# Patient Record
Sex: Male | Born: 1965 | Race: White | Hispanic: No | Marital: Married | Smoking: Never smoker
Health system: Southern US, Community
[De-identification: ages and names within clinical notes are randomized; demographics above are authoritative.]

## PROBLEM LIST (undated history)

## (undated) DIAGNOSIS — Z9109 Other allergy status, other than to drugs and biological substances: Secondary | ICD-10-CM

## (undated) HISTORY — PX: TONSILLECTOMY: SUR1361

---

## 2013-08-26 ENCOUNTER — Ambulatory Visit: Payer: 59 | Admitting: Nurse Practitioner

## 2013-08-26 ENCOUNTER — Encounter (INDEPENDENT_AMBULATORY_CARE_PROVIDER_SITE_OTHER): Payer: Self-pay

## 2013-08-26 ENCOUNTER — Encounter: Payer: Self-pay | Admitting: Nurse Practitioner

## 2013-08-26 VITALS — BP 128/82 | HR 61 | Temp 98.5°F | Ht 72.0 in | Wt 204.4 lb

## 2013-08-26 DIAGNOSIS — Z0289 Encounter for other administrative examinations: Secondary | ICD-10-CM

## 2013-08-26 LAB — POCT URINALYSIS DIPSTICK
Bilirubin, UA: NEGATIVE
GLUCOSE UA: NEGATIVE
Ketones, UA: NEGATIVE
Leukocytes, UA: NEGATIVE
Nitrite, UA: NEGATIVE
Protein, UA: NEGATIVE
RBC UA: NEGATIVE
SPEC GRAV UA: 1.01
Urobilinogen, UA: NEGATIVE
pH, UA: 5

## 2013-08-26 NOTE — Patient Instructions (Signed)
Medical Examiner's Certificate  I certify that I have examined Collin Crosby. In accordance with the [employer] Safety Regulations [phone](49 CFR [phone]) and with knowledge of the driving duties, I find this person is qualified; and, if applicable, only when:      The information I have provided regarding this physical examination is true and complete. A complete examination form with any attachment embodies my findings completely and correctly, and is on file in my office.   _________________________________________ Bennie PieriniMARTIN,MARY MARGARET 08/26/2013  __________________________________________ _________[id number]___________ ___[place]_  Signature of Driver Driver's License No. State    Address of Driver [id number] [place] [id number]  Medical Certificate Expiration Date 08/27/15

## 2013-08-26 NOTE — Progress Notes (Signed)
Commercial Driver Medical Examination   Collin Crosby is a 48 y.o. male who presents today for a commercial driver fitness determination physical exam. The patient reports no problems. The following portions of the patient's history were reviewed and updated as appropriate: allergies, current medications, past family history, past medical history, past social history, past surgical history and problem list. Review of Systems Pertinent items are noted in HPI.   Objective:    Vision:  Uncorrected Corrected Horizontal Field of Vision  Right Eye 20/15  >70 degrees  Left Eye  20/15  >70 degrees  Both Eyes  20/15     Applicant can recognize and distinguish among traffic control signals and devices showing standard red, green, and amber colors.     Monocular Vision?: No   Hearing: whisper test positive     There were no vitals taken for this visit.  General Appearance:    Alert, cooperative, no distress, appears stated age  Head:    Normocephalic, without obvious abnormality, atraumatic  Eyes:    PERRL, conjunctiva/corneas clear, EOM's intact, fundi    benign, both eyes       Ears:    Normal TM's and external ear canals, both ears  Nose:   Nares normal, septum midline, mucosa normal, no drainage    or sinus tenderness  Throat:   Lips, mucosa, and tongue normal; teeth and gums normal  Neck:   Supple, symmetrical, trachea midline, no adenopathy;       thyroid:  No enlargement/tenderness/nodules; no carotid   bruit or JVD  Back:     Symmetric, no curvature, ROM normal, no CVA tenderness  Lungs:     Clear to auscultation bilaterally, respirations unlabored  Chest Vill[place]:    No tenderness or deformity  Heart:    Regular rate and rhythm, S1 and S2 normal, no murmur, rub   or gallop  Abdomen:     Soft, non-tender, bowel sounds active all four quadrants,    no masses, no organomegaly  Genitalia:    Normal male without lesion, discharge or tenderness  Rectal:    Normal tone, normal  prostate, no masses or tenderness;   guaiac negative stool  Extremities:   Extremities normal, atraumatic, no cyanosis or edema  Pulses:   2+ and symmetric all extremities  Skin:   Skin color, texture, turgor normal, no rashes or lesions  Lymph nodes:   Cervical, supraclavicular, and axillary nodes normal  Neurologic:   CNII-XII intact. Normal strength, sensation and reflexes      throughout    Labs: Results for orders placed in visit on 08/26/13  POCT URINALYSIS DIPSTICK      Result Value Ref Range   Color, UA yellow     Clarity, UA clear     Glucose, UA negative     Bilirubin, UA negative     Ketones, UA negative     Spec Grav, UA 1.010     Blood, UA negative     pH, UA 5.0     Protein, UA negative     Urobilinogen, UA negative     Nitrite, UA negative     Leukocytes, UA Negative           Assessment:    Healthy male exam.  Meets standards in 2249 CFR 391.41;  qualifies for 2 year certificate.    Plan:    Medical examiners certificate completed and printed. Return as needed.   Mary-Margaret Daphine DeutscherMartin, FNP

## 2014-04-20 ENCOUNTER — Ambulatory Visit: Payer: Self-pay | Admitting: Nurse Practitioner

## 2015-08-02 ENCOUNTER — Ambulatory Visit (INDEPENDENT_AMBULATORY_CARE_PROVIDER_SITE_OTHER): Payer: Self-pay | Admitting: Nurse Practitioner

## 2015-08-02 ENCOUNTER — Encounter: Payer: Self-pay | Admitting: Nurse Practitioner

## 2015-08-02 VITALS — BP 123/83 | HR 71 | Temp 98.6°F | Ht 71.0 in | Wt 210.0 lb

## 2015-08-02 DIAGNOSIS — Z024 Encounter for examination for driving license: Secondary | ICD-10-CM

## 2015-08-02 LAB — URINALYSIS
Bilirubin, UA: NEGATIVE
Glucose, UA: NEGATIVE
Ketones, UA: NEGATIVE
LEUKOCYTES UA: NEGATIVE
NITRITE UA: NEGATIVE
Protein, UA: NEGATIVE
RBC, UA: NEGATIVE
Specific Gravity, UA: 1.02 (ref 1.005–1.030)
UUROB: 0.2 mg/dL (ref 0.2–1.0)
pH, UA: 5.5 (ref 5.0–7.5)

## 2015-08-02 NOTE — Progress Notes (Signed)
Patient ID: Collin PilgrimMichael Alan Crosby, male   DOB: [birth year], 50 y.o.   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

## 2015-10-03 ENCOUNTER — Encounter: Payer: 59 | Admitting: Nurse Practitioner

## 2016-04-10 ENCOUNTER — Telehealth: Payer: Self-pay

## 2016-04-10 NOTE — Telephone Encounter (Signed)
MMM is out until next week. What does pt need?

## 2016-04-11 NOTE — Telephone Encounter (Signed)
Patient states that he already talked to someone yesterday and will call back if he needs anything else.

## 2016-05-17 ENCOUNTER — Telehealth: Payer: Self-pay | Admitting: Nurse Practitioner

## 2016-05-17 NOTE — Telephone Encounter (Signed)
Ok to write note for patient

## 2016-05-18 NOTE — Telephone Encounter (Signed)
Letter written and to front for pt pick up

## 2016-11-15 ENCOUNTER — Ambulatory Visit (INDEPENDENT_AMBULATORY_CARE_PROVIDER_SITE_OTHER): Payer: 59 | Admitting: Nurse Practitioner

## 2016-11-15 ENCOUNTER — Encounter: Payer: Self-pay | Admitting: Nurse Practitioner

## 2016-11-15 VITALS — BP 126/84 | HR 59 | Temp 97.4°F | Ht 71.0 in | Wt 210.0 lb

## 2016-11-15 DIAGNOSIS — M5442 Lumbago with sciatica, left side: Secondary | ICD-10-CM | POA: Diagnosis not present

## 2016-11-15 MED ORDER — METHYLPREDNISOLONE ACETATE 80 MG/ML IJ SUSP
80.0000 mg | Freq: Once | INTRAMUSCULAR | Status: AC
  Administered 2016-11-15: 80 mg via INTRAMUSCULAR

## 2016-11-15 MED ORDER — CYCLOBENZAPRINE HCL 10 MG PO TABS: 10.0000 mg | ORAL_TABLET | Freq: Three times a day (TID) | ORAL | 1 refills | Status: DC | PRN

## 2016-11-15 MED ORDER — IBUPROFEN 600 MG PO TABS: 600.0000 mg | ORAL_TABLET | Freq: Three times a day (TID) | ORAL | 0 refills | Status: DC | PRN

## 2016-11-15 NOTE — Progress Notes (Signed)
   Subjective:    Patient ID: Collin Crosby, male    DOB: 26-Nov-1965, 50 y.o.   MRN: [id number]  HPI Patient in the office with complaints of low back pain and left leg pain.  Patient is starting to feel some better following resting for 3-4 weeks.  Patient states the only incident of cause he can recall is that he felt like he pulled a muscle when lifting the ramp of a trailer.  Patient has tried ice packs and this seems to help.  No medications tried to date.     Review of Systems  Constitutional: Positive for activity change (resting). Negative for appetite change.  Musculoskeletal: Positive for back pain (x 3-4 wks, bilateral lower back, but more on left). Negative for arthralgias, gait problem and myalgias.       Pain radiating down the medial left thigh when trying to lift his leg    Neurological: Negative for dizziness, light-headedness and numbness.  All other systems reviewed and are negative.      Objective:   Physical Exam  Constitutional: He is oriented to person, place, and time. He appears well-developed and well-nourished. No distress.  Cardiovascular: Normal rate, regular rhythm and normal heart sounds.   Pulmonary/Chest: Effort normal and breath sounds normal.  Musculoskeletal: Normal range of motion. He exhibits no edema.       Lumbar back: Normal. He exhibits normal range of motion, no tenderness, no swelling and no pain.  Motor strength and sensation lower ext intact and eqyually bil.  Neurological: He is alert and oriented to person, place, and time.  Psychiatric: He has a normal mood and affect. His behavior is normal.   BP 126/84   Pulse (!) 59   Temp (!) 97.4 F (36.3 C) (Oral)   Ht  (1.803 m)   Wt 210 lb (95.3 kg)   BMI 29.29 kg/m        Assessment & Plan:  1. Acute midline low back pain with left-sided sciatica Back exercises Moist heat - methylPREDNISolone acetate (DEPO-MEDROL) injection 80 mg; Inject 1 mL (80 mg total) into the muscle  once. - ibuprofen (ADVIL,MOTRIN) 600 MG tablet; Take 1 tablet (600 mg total) by mouth every 8 (eight) hours as needed.  Dispense: 30 tablet; Refill: 0 - cyclobenzaprine (FLEXERIL) 10 MG tablet; Take 1 tablet (10 mg total) by mouth 3 (three) times daily as needed for muscle spasms.  Dispense: 30 tablet; Refill: 1    Labs pending Health maintenance reviewed Diet and exercise encouraged Continue all meds Follow up  In prn   Mary-Margaret Daphine Deutscher, FNP

## 2016-11-15 NOTE — Patient Instructions (Signed)

## 2017-03-06 ENCOUNTER — Telehealth: Payer: Self-pay | Admitting: Nurse Practitioner

## 2017-03-07 NOTE — Telephone Encounter (Signed)
Ok to write letter

## 2017-03-07 NOTE — Telephone Encounter (Signed)
Letter written and pt aware ready for pick up. 

## 2017-07-09 ENCOUNTER — Ambulatory Visit: Payer: Self-pay | Admitting: Nurse Practitioner

## 2017-07-09 ENCOUNTER — Encounter: Payer: Self-pay | Admitting: Nurse Practitioner

## 2017-07-09 DIAGNOSIS — Z024 Encounter for examination for driving license: Secondary | ICD-10-CM

## 2017-07-09 NOTE — Progress Notes (Signed)
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

## 2017-07-25 ENCOUNTER — Encounter: Payer: Self-pay | Admitting: Nurse Practitioner

## 2017-07-25 ENCOUNTER — Ambulatory Visit (INDEPENDENT_AMBULATORY_CARE_PROVIDER_SITE_OTHER): Payer: 59 | Admitting: Nurse Practitioner

## 2017-07-25 ENCOUNTER — Ambulatory Visit (INDEPENDENT_AMBULATORY_CARE_PROVIDER_SITE_OTHER): Payer: 59

## 2017-07-25 VITALS — BP 113/75 | HR 67 | Temp 97.0°F | Ht 71.0 in | Wt 196.0 lb

## 2017-07-25 DIAGNOSIS — Z125 Encounter for screening for malignant neoplasm of prostate: Secondary | ICD-10-CM

## 2017-07-25 DIAGNOSIS — Z0000 Encounter for general adult medical examination without abnormal findings: Secondary | ICD-10-CM

## 2017-07-25 DIAGNOSIS — R81 Glycosuria: Secondary | ICD-10-CM

## 2017-07-25 DIAGNOSIS — Z6827 Body mass index (BMI) 27.0-27.9, adult: Secondary | ICD-10-CM | POA: Diagnosis not present

## 2017-07-25 DIAGNOSIS — M25552 Pain in left hip: Secondary | ICD-10-CM

## 2017-07-25 LAB — BAYER DCA HB A1C WAIVED: HB A1C (BAYER DCA - WAIVED): 4.9 % (ref <7.0)

## 2017-07-25 MED ORDER — PREDNISONE 10 MG (21) PO TBPK: ORAL_TABLET | ORAL | 0 refills | Status: DC

## 2017-07-25 NOTE — Progress Notes (Signed)
   Subjective:    Patient ID: Collin Crosby, male    DOB: 1965/03/11, 52 y.o.   MRN: [id number]   Chief Complaint: Annual Exam   HPI Patient come sin today for annual physical exam. He had a DOT physical several weeks ago and had sugar in urine and was told he needed a physical. He has no current medical problems and is on no meds. He say she feels well. He has been under a lot of stress and has lost 14lbs. He does c/o of left hip pain. Has been bothering him for about 2 months. Rates pain 3-4/10. He has had a steriod shot which really did not help.    Review of Systems  Constitutional: Negative for activity change and appetite change.  HENT: Negative.   Eyes: Negative for pain.  Respiratory: Negative for shortness of breath.   Cardiovascular: Negative for chest pain, palpitations and leg swelling.  Gastrointestinal: Negative for abdominal pain.  Endocrine: Negative for polydipsia.  Genitourinary: Negative.   Skin: Negative for rash.  Neurological: Negative for dizziness, weakness and headaches.  Hematological: Does not bruise/bleed easily.  Psychiatric/Behavioral: Negative.   All other systems reviewed and are negative.      Objective:   Physical Exam  Constitutional: He is oriented to person, place, and time.  HENT:  Head: Normocephalic.  Nose: Nose normal.  Mouth/Throat: Oropharynx is clear and moist.  Eyes: Pupils are equal, round, and reactive to light. EOM are normal.  Neck: Normal range of motion and phonation normal. Neck supple. No JVD present. Carotid bruit is not present. No thyroid mass and no thyromegaly present.  Cardiovascular: Normal rate and regular rhythm.  Pulmonary/Chest: Effort normal and breath sounds normal. No respiratory distress.  Abdominal: Soft. Normal appearance, normal aorta and bowel sounds are normal. There is no tenderness.  Musculoskeletal: Normal range of motion.  FROM of left hip with pain on internal rotation  Lymphadenopathy:   He has no cervical adenopathy.  Neurological: He is alert and oriented to person, place, and time.  Skin: Skin is warm and dry.  Psychiatric: Judgment normal.  Nursing note and vitals reviewed.   BP 113/75   Pulse 67   Temp (!) 97 F (36.1 C) (Oral)   Ht '5\' 11"'$  (1.803 m)   Wt 196 lb (88.9 kg)   BMI 27.34 kg/m   Hip xray- joint space narrowing of left hip-Preliminary reading by Ronnald Collum, FNP  [facility]      Assessment & Plan:  Collin Crosby comes in today with chief complaint of Annual Exam   Diagnosis and orders addressed:  1. Annual physical exam - CMP14+EGFR - Lipid panel  2. Glucose found in urine on examination - Bayer DCA Hb A1c Waived  3. Screening for prostate cancer - PSA, total and free  4. BMI 27.0-27.9,adult Discussed diet and exercise for person with BMI >25 Will recheck weight in 3-6 months  5. Left hip pain If no better will do ortho referral - DG HIP UNILAT W OR W/O PELVIS 2-3 VIEWS LEFT; Future - predniSONE (STERAPRED UNI-PAK 21 TAB) 10 MG (21) TBPK tablet; As directed x 6 days  Dispense: 21 tablet; Refill: 0   Labs pending Health Maintenance reviewed Diet and exercise encouraged  Follow up plan: 6 months   Mary-Margaret Hassell Done, FNP

## 2017-07-25 NOTE — Patient Instructions (Signed)

## 2017-07-26 LAB — CMP14+EGFR
ALBUMIN: 4 g/dL (ref 3.5–5.5)
ALT: 17 IU/L (ref 0–44)
AST: 13 IU/L (ref 0–40)
Albumin/Globulin Ratio: 1.7 (ref 1.2–2.2)
Alkaline Phosphatase: 82 IU/L (ref 39–117)
BILIRUBIN TOTAL: 0.6 mg/dL (ref 0.0–1.2)
BUN / CREAT RATIO: 12 (ref 9–20)
BUN: 10 mg/dL (ref 6–24)
CHLORIDE: 105 mmol/L (ref 96–106)
CO2: 25 mmol/L (ref 20–29)
Calcium: 8.6 mg/dL — ABNORMAL LOW (ref 8.7–10.2)
Creatinine, Ser: 0.81 mg/dL (ref 0.76–1.27)
GFR calc non Af Amer: 103 mL/min/{1.73_m2} (ref >59)
GFR, EST AFRICAN AMERICAN: 119 mL/min/{1.73_m2} (ref >59)
Globulin, Total: 2.3 g/dL (ref 1.5–4.5)
Glucose: 92 mg/dL (ref 65–99)
POTASSIUM: 4.3 mmol/L (ref 3.5–5.2)
Sodium: 141 mmol/L (ref 134–144)
TOTAL PROTEIN: 6.3 g/dL (ref 6.0–8.5)

## 2017-07-26 LAB — LIPID PANEL
Chol/HDL Ratio: 4.1 ratio (ref 0.0–5.0)
Cholesterol, Total: 157 mg/dL (ref 100–199)
HDL: 38 mg/dL — ABNORMAL LOW (ref >39)
LDL Calculated: 85 mg/dL (ref 0–99)
Triglycerides: 169 mg/dL — ABNORMAL HIGH (ref 0–149)
VLDL CHOLESTEROL CAL: 34 mg/dL (ref 5–40)

## 2017-07-26 LAB — PSA, TOTAL AND FREE
PSA, Free Pct: 41.3 %
PSA, Free: 0.33 ng/mL
Prostate Specific Ag, Serum: 0.8 ng/mL (ref 0.0–4.0)

## 2017-08-01 ENCOUNTER — Telehealth: Payer: Self-pay | Admitting: Nurse Practitioner

## 2017-08-12 ENCOUNTER — Ambulatory Visit (INDEPENDENT_AMBULATORY_CARE_PROVIDER_SITE_OTHER): Payer: 59 | Admitting: Nurse Practitioner

## 2017-08-12 ENCOUNTER — Encounter: Payer: Self-pay | Admitting: Nurse Practitioner

## 2017-08-12 VITALS — BP 124/79 | HR 77 | Temp 97.2°F | Ht 71.0 in | Wt 196.0 lb

## 2017-08-12 DIAGNOSIS — M25552 Pain in left hip: Secondary | ICD-10-CM | POA: Diagnosis not present

## 2017-08-12 NOTE — Progress Notes (Signed)
   Subjective:    Patient ID: Collin PilgrimMichael Alan Maue, male    DOB: [birth year], 52 y.o.   MRN: [id number]   Chief Complaint: Hip Pain (Left)   HPI Patient was in on 07/25/17 for annual physical exam. At that time he c/o hip pain. Xray was done which showed mid degenerative changes. He was given a 6 day steroid pack. He says he can tell no difference. He says it feels as if it is going to pop out of joint. Seems to bother him the most at night. Sitting seems to increase pain as well. Walking doe snot seem to bother him. Pain stays in hip joint and does not radiate. Rates pain currenlty 2-3/10. Pain can go as high as 6-7/10 intermittently.   Review of Systems  Constitutional: Negative.   Respiratory: Negative.   Cardiovascular: Negative.   Musculoskeletal: Positive for arthralgias (left hip).  Neurological: Negative.   Psychiatric/Behavioral: Negative.   All other systems reviewed and are negative.      Objective:   Physical Exam  Constitutional: He is oriented to person, place, and time. He appears well-developed and well-nourished. He appears distressed (mild).  Cardiovascular: Normal rate.  Pulmonary/Chest: Effort normal and breath sounds normal.  Musculoskeletal: He exhibits no edema.  Decrease ROM of left hip with pain on etension and internal rotation.  Neurological: He is alert and oriented to person, place, and time. Coordination normal.  Psychiatric: He has a normal mood and affect. His behavior is normal. Judgment and thought content normal.   BP 124/79   Pulse 77   Temp (!) 97.2 F (36.2 C) (Oral)   Ht 5\' 11"  (1.803 m)   Wt 196 lb (88.9 kg)   BMI 27.34 kg/m       Assessment & Plan:  Collin PilgrimMichael Alan Deberry in today with chief complaint of Hip Pain (Left)   1. Left hip pain Moist heat to hip rest - Ambulatory referral to Orthopedic Surgery rto prn  Mary-Margaret Daphine DeutscherMartin, FNP

## 2017-08-12 NOTE — Patient Instructions (Signed)

## 2017-10-29 ENCOUNTER — Ambulatory Visit: Payer: 59 | Admitting: Nurse Practitioner

## 2017-11-26 ENCOUNTER — Encounter: Payer: Self-pay | Admitting: Nurse Practitioner

## 2017-11-26 ENCOUNTER — Ambulatory Visit (INDEPENDENT_AMBULATORY_CARE_PROVIDER_SITE_OTHER): Payer: 59

## 2017-11-26 ENCOUNTER — Ambulatory Visit (INDEPENDENT_AMBULATORY_CARE_PROVIDER_SITE_OTHER): Payer: 59 | Admitting: Nurse Practitioner

## 2017-11-26 VITALS — BP 120/78 | HR 67 | Temp 98.3°F | Ht 71.0 in | Wt 201.0 lb

## 2017-11-26 DIAGNOSIS — Z23 Encounter for immunization: Secondary | ICD-10-CM | POA: Diagnosis not present

## 2017-11-26 DIAGNOSIS — Z01818 Encounter for other preprocedural examination: Secondary | ICD-10-CM

## 2017-11-26 NOTE — Progress Notes (Signed)
Subjective:    Patient ID: Collin Crosby, male    DOB: 01/15/1966, 51 y.o.   MRN: [id number]   Chief Complaint: surgical clearance   HPI Patient comes in today getting ready for hip replacement. He has no other medical problems and is on no meds.    Review of Systems  Constitutional: Negative for activity change and appetite change.  HENT: Negative.   Eyes: Negative for pain.  Respiratory: Negative for shortness of breath.   Cardiovascular: Negative for chest pain, palpitations and leg swelling.  Gastrointestinal: Negative for abdominal pain.  Endocrine: Negative for polydipsia.  Genitourinary: Negative.   Skin: Negative for rash.  Neurological: Negative for dizziness, weakness and headaches.  Hematological: Does not bruise/bleed easily.  Psychiatric/Behavioral: Negative.   All other systems reviewed and are negative.      Objective:   Physical Exam  Constitutional: He is oriented to person, place, and time. He appears well-developed and well-nourished.  HENT:  Head: Normocephalic.  Nose: Nose normal.  Mouth/Throat: Oropharynx is clear and moist.  Eyes: Pupils are equal, round, and reactive to light. EOM are normal.  Neck: Normal range of motion and phonation normal. Neck supple. No JVD present. Carotid bruit is not present. No thyroid mass and no thyromegaly present.  Cardiovascular: Normal rate and regular rhythm.  Pulmonary/Chest: Effort normal and breath sounds normal. No respiratory distress.  Abdominal: Soft. Normal appearance, normal aorta and bowel sounds are normal. There is no tenderness.  Musculoskeletal: Normal range of motion.  Lymphadenopathy:    He has no cervical adenopathy.  Neurological: He is alert and oriented to person, place, and time.  Skin: Skin is warm and dry.  Psychiatric: He has a normal mood and affect. His behavior is normal. Judgment and thought content normal.  Nursing note and vitals reviewed.   BP 120/78   Pulse 67   Temp  98.3 F (36.8 C) (Oral)   Ht 5' 11" (1.803 m)   Wt 201 lb (91.2 kg)   BMI 28.03 kg/m   Chest xray - clear-Preliminary reading by Mary , FNP  [facility] EKG- NSR- Mary-Margaret , FNP      Assessment & Plan:  Collin Crosby in today with chief complaint of surgical clearance   1. Preoperative clearance medically cleared for surgery - DG Chest 2 View; Future - EKG 12-Lead - CMP14+EGFR - CBC with Differential/Platelet  Mary-Margaret , FNP  

## 2017-11-29 NOTE — Patient Instructions (Signed)
Collin Crosby  11/29/2017   Your procedure is scheduled on: 12-10-17     Report to [facility] Main  Entrance    Report to Admitting at 9:00 AM    Call this number if you have problems the morning of surgery [phone]     Remember: Do not eat food or drink liquids :After Midnight.    Take these medicines the morning of surgery with A SIP OF WATER: None. You may bring and use your nasal spray as needed.                BRUSH YOUR TEETH MORNING OF SURGERY AND RINSE YOUR MOUTH OUT, NO CHEWING GUM CANDY OR MINTS.              You may not have any metal on your body including hair pins and              piercings  Do not wear jewelry, lotions, cologne, powders or deodorant             Men may shave face and neck.   Do not bring valuables to the hospital. [name]   FOR VALUABLES.  Contacts, dentures or bridgework may not be worn into surgery.  Leave suitcase in the car. After surgery it may be brought to your room.     Special Instructions: N/A              Please read over the following fact sheets you were given: _____________________________________________________________________             [name] Before surgery, you can play an important role.  Because skin is not sterile, your skin needs to be as free of germs as possible.  You can reduce the number of germs on your skin by washing with CHG (chlorahexidine gluconate) soap before surgery.  CHG is an antiseptic cleaner which kills germs and bonds with the skin to continue killing germs even after washing. Please DO NOT use if you have an allergy to CHG or antibacterial soaps.  If your skin becomes reddened/irritated stop using the CHG and inform your nurse when you arrive at Short Stay. Do not shave (including legs and underarms) for at least 48 hours prior to the first CHG shower.  You may shave your face/neck. Please follow these  instructions carefully:  1.  Shower with CHG Soap the night before surgery and the  morning of Surgery.  2.  If you choose to wash your hair, wash your hair first as usual with your  normal  shampoo.  3.  After you shampoo, rinse your hair and body thoroughly to remove the  shampoo.                           4.  Use CHG as you would any other liquid soap.  You can apply chg directly  to the skin and wash                       Gently with a scrungie or clean washcloth.  5.  Apply the CHG Soap to your body ONLY FROM THE NECK DOWN.   Do not use on face/ open  Wound or open sores. Avoid contact with eyes, ears mouth and genitals (private parts).                       Wash face,  Genitals (private parts) with your normal soap.             6.  Wash thoroughly, paying special attention to the area where your surgery  will be performed.  7.  Thoroughly rinse your body with warm water from the neck down.  8.  DO NOT shower/wash with your normal soap after using and rinsing off  the CHG Soap.                9.  Pat yourself dry with a clean towel.            10.  Wear clean pajamas.            11.  Place clean sheets on your bed the night of your first shower and do not  sleep with pets. Day of Surgery : Do not apply any lotions/deodorants the morning of surgery.  Please wear clean clothes to the hospital/surgery center.  FAILURE TO FOLLOW THESE INSTRUCTIONS MAY RESULT IN THE CANCELLATION OF YOUR SURGERY PATIENT SIGNATURE_________________________________  NURSE SIGNATURE__________________________________  ________________________________________________________________________   Collin Crosby  An incentive spirometer is a tool that can help keep your lungs clear and active. This tool measures how well you are filling your lungs with each breath. Taking long deep breaths may help reverse or decrease the chance of developing breathing (pulmonary) problems (especially  infection) following:  A long period of time when you are unable to move or be active. BEFORE THE PROCEDURE   If the spirometer includes an indicator to show your best effort, your nurse or respiratory therapist will set it to a desired goal.  If possible, sit up straight or lean slightly forward. Try not to slouch.  Hold the incentive spirometer in an upright position. INSTRUCTIONS FOR USE  1. Sit on the edge of your bed if possible, or sit up as far as you can in bed or on a chair. 2. Hold the incentive spirometer in an upright position. 3. Breathe out normally. 4. Place the mouthpiece in your mouth and seal your lips tightly around it. 5. Breathe in slowly and as deeply as possible, raising the piston or the ball toward the top of the column. 6. Hold your breath for 3-5 seconds or for as long as possible. Allow the piston or ball to fall to the bottom of the column. 7. Remove the mouthpiece from your mouth and breathe out normally. 8. Rest for a few seconds and repeat Steps 1 through 7 at least 10 times every 1-2 hours when you are awake. Take your time and take a few normal breaths between deep breaths. 9. The spirometer may include an indicator to show your best effort. Use the indicator as a goal to work toward during each repetition. 10. After each set of 10 deep breaths, practice coughing to be sure your lungs are clear. If you have an incision (the cut made at the time of surgery), support your incision when coughing by placing a pillow or rolled up towels firmly against it. Once you are able to get out of bed, walk around indoors and cough well. You may stop using the incentive spirometer when instructed by your caregiver.  RISKS AND COMPLICATIONS  Take your time so you do not get  dizzy or light-headed.  If you are in pain, you may need to take or ask for pain medication before doing incentive spirometry. It is harder to take a deep breath if you are having pain. AFTER  USE  Rest and breathe slowly and easily.  It can be helpful to keep track of a log of your progress. Your caregiver can provide you with a simple table to help with this. If you are using the spirometer at home, follow these instructions: [place] IF:   You are having difficultly using the spirometer.  You have trouble using the spirometer as often as instructed.  Your pain medication is not giving enough relief while using the spirometer.  You develop fever of 100.5 F (38.1 C) or higher. SEEK IMMEDIATE MEDICAL CARE IF:   You cough up bloody sputum that had not been present before.  You develop fever of 102 F (38.9 C) or greater.  You develop worsening pain at or near the incision site. MAKE SURE YOU:   Understand these instructions.  Will watch your condition.  Will get help right away if you are not doing well or get worse. Document Released: 06/25/2006 Document Revised: 05/07/2011 Document Reviewed: 08/26/2006 Exit[employer] Patient Information 2014 Exit[employer], [place].   ________________________________________________________________________  WHAT IS A BLOOD TRANSFUSION? Blood Transfusion Information  A transfusion is the replacement of blood or some of its parts. Blood is made up of multiple cells which provide different functions.  Red blood cells carry oxygen and are used for blood loss replacement.  White blood cells fight against infection.  Platelets control bleeding.  Plasma helps clot blood.  Other blood products are available for specialized needs, such as hemophilia or other clotting disorders. BEFORE THE TRANSFUSION  Who gives blood for transfusions?   Healthy volunteers who are fully evaluated to make sure their blood is safe. This is blood bank blood. Transfusion therapy is the safest it has ever been in the practice of medicine. Before blood is taken from a donor, a complete history is taken to make sure that person has no history of diseases  nor engages in risky social behavior (examples are intravenous drug use or sexual activity with multiple partners). The donor's travel history is screened to minimize risk of transmitting infections, such as malaria. The donated blood is tested for signs of infectious diseases, such as HIV and hepatitis. The blood is then tested to be sure it is compatible with you in order to minimize the chance of a transfusion reaction. If you or a relative donates blood, this is often done in anticipation of surgery and is not appropriate for emergency situations. It takes many days to process the donated blood. RISKS AND COMPLICATIONS Although transfusion therapy is very safe and saves many lives, the main dangers of transfusion include:   Getting an infectious disease.  Developing a transfusion reaction. This is an allergic reaction to something in the blood you were given. Every precaution is taken to prevent this. The decision to have a blood transfusion has been considered carefully by your caregiver before blood is given. Blood is not given unless the benefits outweigh the risks. AFTER THE TRANSFUSION  Right after receiving a blood transfusion, you will usually feel much better and more energetic. This is especially true if your red blood cells have gotten low (anemic). The transfusion raises the level of the red blood cells which carry oxygen, and this usually causes an energy increase.  The nurse administering the transfusion will  monitor you carefully for complications. HOME CARE INSTRUCTIONS  No special instructions are needed after a transfusion. You may find your energy is better. Speak with your caregiver about any limitations on activity for underlying diseases you may have. SEEK MEDICAL CARE IF:   Your condition is not improving after your transfusion.  You develop redness or irritation at the intravenous (IV) site. SEEK IMMEDIATE MEDICAL CARE IF:  Any of the following symptoms occur over the  next 12 hours:  Shaking chills.  You have a temperature by mouth above 102 F (38.9 C), not controlled by medicine.  Chest, back, or muscle pain.  People around you feel you are not acting correctly or are confused.  Shortness of breath or difficulty breathing.  Dizziness and fainting.  You get a rash or develop hives.  You have a decrease in urine output.  Your urine turns a dark color or changes to pink, red, or brown. Any of the following symptoms occur over the next 10 days:  You have a temperature by mouth above 102 F (38.9 C), not controlled by medicine.  Shortness of breath.  Weakness after normal activity.  The white part of the eye turns yellow (jaundice).  You have a decrease in the amount of urine or are urinating less often.  Your urine turns a dark color or changes to pink, red, or brown. Document Released: 02/10/2000 Document Revised: 05/07/2011 Document Reviewed: 09/29/2007 [facility] Patient Information 2014 [place], [place].  _______________________________________________________________________

## 2017-11-29 NOTE — Progress Notes (Addendum)
11-26-17 (Epic) EKG,  CXR, Surgical Clearance from Dr. Daphine Deutscher

## 2017-12-03 ENCOUNTER — Other Ambulatory Visit: Payer: Self-pay

## 2017-12-03 ENCOUNTER — Encounter (HOSPITAL_COMMUNITY): Payer: Self-pay

## 2017-12-03 ENCOUNTER — Encounter (HOSPITAL_COMMUNITY)
Admission: RE | Admit: 2017-12-03 | Discharge: 2017-12-03 | Disposition: A | Discharge status: Home / Self Care | Payer: 59 | Source: Ambulatory Visit | Attending: Orthopedic Surgery | Admitting: Orthopedic Surgery

## 2017-12-03 DIAGNOSIS — Z01818 Encounter for other preprocedural examination: Secondary | ICD-10-CM | POA: Diagnosis present

## 2017-12-03 DIAGNOSIS — M1612 Unilateral primary osteoarthritis, left hip: Secondary | ICD-10-CM | POA: Insufficient documentation

## 2017-12-03 HISTORY — DX: Other allergy status, other than to drugs and biological substances: Z91.09

## 2017-12-03 LAB — CBC
HEMATOCRIT: 47.1 % (ref 39.0–52.0)
Hemoglobin: 16.3 g/dL (ref 13.0–17.0)
MCH: 31.3 pg (ref 26.0–34.0)
MCHC: 34.6 g/dL (ref 30.0–36.0)
MCV: 90.4 fL (ref 80.0–100.0)
Platelets: 193 10*3/uL (ref 150–400)
RBC: 5.21 MIL/uL (ref 4.22–5.81)
RDW: 12.2 % (ref 11.5–15.5)
WBC: 4.9 10*3/uL (ref 4.0–10.5)
nRBC: 0 % (ref 0.0–0.2)

## 2017-12-03 LAB — SURGICAL PCR SCREEN
MRSA, PCR: NEGATIVE
STAPHYLOCOCCUS AUREUS: POSITIVE — AB

## 2017-12-03 LAB — BASIC METABOLIC PANEL
Anion gap: 8 (ref 5–15)
BUN: 13 mg/dL (ref 6–20)
CHLORIDE: 108 mmol/L (ref 98–111)
CO2: 27 mmol/L (ref 22–32)
CREATININE: 0.93 mg/dL (ref 0.61–1.24)
Calcium: 9.3 mg/dL (ref 8.9–10.3)
GFR calc Af Amer: >60 mL/min (ref >60)
GFR calc non Af Amer: >60 mL/min (ref >60)
Glucose, Bld: 113 mg/dL — ABNORMAL HIGH (ref 70–99)
POTASSIUM: 4.3 mmol/L (ref 3.5–5.1)
Sodium: 143 mmol/L (ref 135–145)

## 2017-12-03 LAB — ABO/RH: ABO/RH(D): AB NEG

## 2017-12-07 NOTE — H&P (Signed)
TOTAL HIP ADMISSION H&P  Patient is admitted for left total hip arthroplasty, anterior approach.  Subjective:  Chief Complaint:   Left hip primary OA / pain  HPI: Collin Crosby, 52 y.o. male, has a history of pain and functional disability in the left hip(s) due to arthritis and patient has failed non-surgical conservative treatments for greater than 12 weeks to include NSAID's and/or analgesics and activity modification.  Onset of symptoms was gradual starting ~1 years ago with gradually worsening course since that time.The patient noted no past surgery on the left hip(s).  Patient currently rates pain in the left hip at 5 out of 10 with activity. Patient has night pain, worsening of pain with activity and weight bearing, trendelenberg gait, pain that interfers with activities of daily living and pain with passive range of motion. Patient has evidence of periarticular osteophytes and joint space narrowing by imaging studies. This condition presents safety issues increasing the risk of falls.  There is no current active infection.  Risks, benefits and expectations were discussed with the patient.  Risks including but not limited to the risk of anesthesia, blood clots, nerve damage, blood vessel damage, failure of the prosthesis, infection and up to and including death.  Patient understand the risks, benefits and expectations and wishes to proceed with surgery.   PCP: Bennie Pierini, FNP  D/C Plans:       Home   Post-op Meds:       No Rx given   Tranexamic Acid:      To be given - IV   Decadron:      Is to be given  FYI:      ASA  [employer]  DME:   Pt already has equipment  PT:   No PT   Patient Active Problem List   Diagnosis Date Noted  . BMI 27.0-27.9,adult 07/25/2017   Past Medical History:  Diagnosis Date  . Environmental allergies     Past Surgical History:  Procedure Laterality Date  . TONSILLECTOMY      No current facility-administered medications for this  encounter.    Current Outpatient Medications  Medication Sig Dispense Refill Last Dose  . fluticasone (FLONASE) 50 MCG/ACT nasal spray Place 1 spray into both nostrils daily as needed for allergies or rhinitis.   Taking   Allergies  Allergen Reactions  . Shellfish Allergy Anaphylaxis, Itching, Swelling, Rash and Other (See Comments)    IV Dye and Shrimp  . Penicillins Itching, Rash and Other (See Comments)    Has patient had a PCN reaction causing immediate rash, facial/tongue/throat swelling, SOB or lightheadedness with hypotension: No Has patient had a PCN reaction causing severe rash involving mucus membranes or skin necrosis: No Has patient had a PCN reaction that required hospitalization: No Has patient had a PCN reaction occurring within the last 10 years: No If all of the above answers are "NO", then may proceed with Cephalosporin use.     Social History   Tobacco Use  . Smoking status: Never Smoker  . Smokeless tobacco: Never Used  Substance Use Topics  . Alcohol use: Yes    Comment: social       Review of Systems  Constitutional: Negative.   HENT: Negative.   Eyes: Negative.   Respiratory: Negative.   Cardiovascular: Negative.   Gastrointestinal: Negative.   Genitourinary: Negative.   Musculoskeletal: Positive for joint pain.  Skin: Negative.   Neurological: Negative.   Endo/Heme/Allergies: Positive for environmental allergies.  Psychiatric/Behavioral: Negative.  Objective:  Physical Exam  Constitutional: He is oriented to person, place, and time. He appears well-developed.  HENT:  Head: Normocephalic.  Eyes: Pupils are equal, round, and reactive to light.  Neck: Neck supple. No JVD present. No tracheal deviation present. No thyromegaly present.  Cardiovascular: Normal rate, regular rhythm and intact distal pulses.  Respiratory: Effort normal and breath sounds normal. No respiratory distress. He has no wheezes.  GI: Soft. There is no tenderness. There  is no guarding.  Musculoskeletal:       Left hip: He exhibits decreased range of motion, decreased strength, tenderness and bony tenderness. He exhibits no swelling, no deformity and no laceration.  Lymphadenopathy:    He has no cervical adenopathy.  Neurological: He is alert and oriented to person, place, and time.  Skin: Skin is warm and dry.  Psychiatric: He has a normal mood and affect.     Labs:  Estimated body mass index is 27.25 kg/m as calculated from the following:   Height as of 12/03/17: 5\' 11"  (1.803 m).   Weight as of 12/03/17: 88.6 kg.   Imaging Review Plain radiographs demonstrate severe degenerative joint disease of the left hip(s). The bone quality appears to be good for age and reported activity level.    Preoperative templating of the joint replacement has been completed, documented, and submitted to the Operating Room personnel in order to optimize intra-operative equipment management.     Assessment/Plan:  End stage arthritis, left hip(s)  The patient history, physical examination, clinical judgement of the provider and imaging studies are consistent with end stage degenerative joint disease of the left hip(s) and total hip arthroplasty is deemed medically necessary. The treatment options including medical management, injection therapy, arthroscopy and arthroplasty were discussed at length. The risks and benefits of total hip arthroplasty were presented and reviewed. The risks due to aseptic loosening, infection, stiffness, dislocation/subluxation,  thromboembolic complications and other imponderables were discussed.  The patient acknowledged the explanation, agreed to proceed with the plan and consent was signed. Patient is being admitted for inpatient treatment for surgery, pain control, PT, OT, prophylactic antibiotics, VTE prophylaxis, progressive ambulation and ADL's and discharge planning.The patient is planning to be discharged home.      Anastasio Auerbach  Angla Delahunt   PA-C  12/07/2017, 10:17 PM

## 2017-12-10 ENCOUNTER — Inpatient Hospital Stay (HOSPITAL_COMMUNITY): Payer: 59

## 2017-12-10 ENCOUNTER — Inpatient Hospital Stay (HOSPITAL_COMMUNITY)
Admission: RE | Admit: 2017-12-10 | Discharge: 2017-12-11 | DRG: 470 | Disposition: A | Discharge status: Home / Self Care | Payer: 59 | Attending: Orthopedic Surgery | Admitting: Orthopedic Surgery

## 2017-12-10 ENCOUNTER — Other Ambulatory Visit: Payer: Self-pay

## 2017-12-10 ENCOUNTER — Encounter (HOSPITAL_COMMUNITY)
Admission: RE | Disposition: A | Discharge status: Home / Self Care | Payer: Self-pay | Source: Home / Self Care | Attending: Orthopedic Surgery

## 2017-12-10 ENCOUNTER — Inpatient Hospital Stay (HOSPITAL_COMMUNITY): Payer: 59 | Admitting: Anesthesiology

## 2017-12-10 ENCOUNTER — Encounter (HOSPITAL_COMMUNITY): Payer: Self-pay | Admitting: Anesthesiology

## 2017-12-10 DIAGNOSIS — Z91013 Allergy to seafood: Secondary | ICD-10-CM | POA: Diagnosis not present

## 2017-12-10 DIAGNOSIS — M25752 Osteophyte, left hip: Secondary | ICD-10-CM | POA: Diagnosis present

## 2017-12-10 DIAGNOSIS — Z91041 Radiographic dye allergy status: Secondary | ICD-10-CM | POA: Diagnosis not present

## 2017-12-10 DIAGNOSIS — Z880 Allergy status to penicillin: Secondary | ICD-10-CM | POA: Diagnosis not present

## 2017-12-10 DIAGNOSIS — Z96649 Presence of unspecified artificial hip joint: Secondary | ICD-10-CM

## 2017-12-10 DIAGNOSIS — Z96642 Presence of left artificial hip joint: Secondary | ICD-10-CM

## 2017-12-10 DIAGNOSIS — M1612 Unilateral primary osteoarthritis, left hip: Principal | ICD-10-CM | POA: Diagnosis present

## 2017-12-10 DIAGNOSIS — Z79899 Other long term (current) drug therapy: Secondary | ICD-10-CM

## 2017-12-10 DIAGNOSIS — Z419 Encounter for procedure for purposes other than remedying health state, unspecified: Secondary | ICD-10-CM

## 2017-12-10 HISTORY — PX: TOTAL HIP ARTHROPLASTY: SHX124

## 2017-12-10 LAB — TYPE AND SCREEN
ABO/RH(D): AB NEG
Antibody Screen: NEGATIVE

## 2017-12-10 SURGERY — ARTHROPLASTY, HIP, TOTAL, ANTERIOR APPROACH
Anesthesia: Monitor Anesthesia Care | Site: Hip | Laterality: Left

## 2017-12-10 MED ORDER — PROPOFOL 10 MG/ML IV BOLUS
INTRAVENOUS | Status: AC
  Filled 2017-12-10: qty 20

## 2017-12-10 MED ORDER — ALUM & MAG HYDROXIDE-SIMETH 200-200-20 MG/5ML PO SUSP: 15.0000 mL | ORAL | Status: DC | PRN

## 2017-12-10 MED ORDER — METHOCARBAMOL 500 MG PO TABS: 500.0000 mg | ORAL_TABLET | Freq: Four times a day (QID) | ORAL | 0 refills | Status: DC | PRN

## 2017-12-10 MED ORDER — PROPOFOL 10 MG/ML IV BOLUS
INTRAVENOUS | Status: DC | PRN
  Administered 2017-12-10: 10 mg via INTRAVENOUS

## 2017-12-10 MED ORDER — CHLORHEXIDINE GLUCONATE 4 % EX LIQD: 60.0000 mL | Freq: Once | CUTANEOUS | Status: DC

## 2017-12-10 MED ORDER — TRANEXAMIC ACID-NACL 1000-0.7 MG/100ML-% IV SOLN
1000.0000 mg | INTRAVENOUS | Status: AC
  Administered 2017-12-10: 1000 mg via INTRAVENOUS
  Filled 2017-12-10: qty 100

## 2017-12-10 MED ORDER — SODIUM CHLORIDE 0.9 % IR SOLN
Status: DC | PRN
  Administered 2017-12-10: 1000 mL

## 2017-12-10 MED ORDER — MORPHINE SULFATE (PF) 4 MG/ML IV SOLN: 0.5000 mg | INTRAVENOUS | Status: DC | PRN

## 2017-12-10 MED ORDER — SODIUM CHLORIDE 0.9 % IV SOLN
INTRAVENOUS | Status: DC
  Administered 2017-12-10 – 2017-12-11 (×2): via INTRAVENOUS

## 2017-12-10 MED ORDER — FERROUS SULFATE 325 (65 FE) MG PO TABS: 325.0000 mg | ORAL_TABLET | Freq: Three times a day (TID) | ORAL | 3 refills | Status: DC

## 2017-12-10 MED ORDER — CEFAZOLIN SODIUM-DEXTROSE 2-4 GM/100ML-% IV SOLN
2.0000 g | INTRAVENOUS | Status: AC
  Administered 2017-12-10: 2 g via INTRAVENOUS
  Filled 2017-12-10: qty 100

## 2017-12-10 MED ORDER — BUPIVACAINE IN DEXTROSE 0.75-8.25 % IT SOLN
INTRATHECAL | Status: DC | PRN
  Administered 2017-12-10: 2 mL via INTRATHECAL

## 2017-12-10 MED ORDER — ACETAMINOPHEN 325 MG PO TABS: 325.0000 mg | ORAL_TABLET | Freq: Four times a day (QID) | ORAL | Status: DC | PRN

## 2017-12-10 MED ORDER — ASPIRIN 81 MG PO CHEW: 81.0000 mg | CHEWABLE_TABLET | Freq: Two times a day (BID) | ORAL | 0 refills | Status: AC

## 2017-12-10 MED ORDER — TRANEXAMIC ACID-NACL 1000-0.7 MG/100ML-% IV SOLN
1000.0000 mg | Freq: Once | INTRAVENOUS | Status: AC
  Administered 2017-12-10: 1000 mg via INTRAVENOUS
  Filled 2017-12-10: qty 100

## 2017-12-10 MED ORDER — METOCLOPRAMIDE HCL 5 MG PO TABS: 5.0000 mg | ORAL_TABLET | Freq: Three times a day (TID) | ORAL | Status: DC | PRN

## 2017-12-10 MED ORDER — MIDAZOLAM HCL 5 MG/5ML IJ SOLN
INTRAMUSCULAR | Status: DC | PRN
  Administered 2017-12-10: 2 mg via INTRAVENOUS

## 2017-12-10 MED ORDER — PHENYLEPHRINE 40 MCG/ML (10ML) SYRINGE FOR IV PUSH (FOR BLOOD PRESSURE SUPPORT)
PREFILLED_SYRINGE | INTRAVENOUS | Status: DC | PRN
  Administered 2017-12-10 (×2): 40 ug via INTRAVENOUS
  Administered 2017-12-10: 80 ug via INTRAVENOUS
  Administered 2017-12-10: 40 ug via INTRAVENOUS

## 2017-12-10 MED ORDER — HYDROCODONE-ACETAMINOPHEN 7.5-325 MG PO TABS: 1.0000 | ORAL_TABLET | ORAL | 0 refills | Status: DC | PRN

## 2017-12-10 MED ORDER — MIDAZOLAM HCL 2 MG/2ML IJ SOLN
INTRAMUSCULAR | Status: AC
  Filled 2017-12-10: qty 2

## 2017-12-10 MED ORDER — FERROUS SULFATE 325 (65 FE) MG PO TABS
325.0000 mg | ORAL_TABLET | Freq: Three times a day (TID) | ORAL | Status: DC
  Administered 2017-12-11 (×2): 325 mg via ORAL
  Filled 2017-12-10 (×3): qty 1

## 2017-12-10 MED ORDER — FENTANYL CITRATE (PF) 100 MCG/2ML IJ SOLN
INTRAMUSCULAR | Status: DC | PRN
  Administered 2017-12-10 (×2): 25 ug via INTRAVENOUS
  Administered 2017-12-10: 50 ug via INTRAVENOUS

## 2017-12-10 MED ORDER — CEFAZOLIN SODIUM-DEXTROSE 2-4 GM/100ML-% IV SOLN
2.0000 g | Freq: Four times a day (QID) | INTRAVENOUS | Status: AC
  Administered 2017-12-10 (×2): 2 g via INTRAVENOUS
  Filled 2017-12-10 (×2): qty 100

## 2017-12-10 MED ORDER — DOCUSATE SODIUM 100 MG PO CAPS
100.0000 mg | ORAL_CAPSULE | Freq: Two times a day (BID) | ORAL | Status: DC
  Administered 2017-12-10 – 2017-12-11 (×2): 100 mg via ORAL
  Filled 2017-12-10 (×3): qty 1

## 2017-12-10 MED ORDER — PHENOL 1.4 % MT LIQD: 1.0000 | OROMUCOSAL | Status: DC | PRN

## 2017-12-10 MED ORDER — HYDROCODONE-ACETAMINOPHEN 5-325 MG PO TABS
1.0000 | ORAL_TABLET | ORAL | Status: DC | PRN
  Administered 2017-12-10: 1 via ORAL
  Administered 2017-12-10 – 2017-12-11 (×3): 2 via ORAL
  Filled 2017-12-10: qty 2
  Filled 2017-12-10: qty 1
  Filled 2017-12-10 (×2): qty 2

## 2017-12-10 MED ORDER — DEXAMETHASONE SODIUM PHOSPHATE 10 MG/ML IJ SOLN
10.0000 mg | Freq: Once | INTRAMUSCULAR | Status: AC
  Administered 2017-12-10: 10 mg via INTRAVENOUS

## 2017-12-10 MED ORDER — METHOCARBAMOL 500 MG IVPB - SIMPLE MED
500.0000 mg | Freq: Four times a day (QID) | INTRAVENOUS | Status: DC | PRN
  Filled 2017-12-10: qty 50

## 2017-12-10 MED ORDER — HYDROMORPHONE HCL 1 MG/ML IJ SOLN: 0.2500 mg | INTRAMUSCULAR | Status: DC | PRN

## 2017-12-10 MED ORDER — MENTHOL 3 MG MT LOZG: 1.0000 | LOZENGE | OROMUCOSAL | Status: DC | PRN

## 2017-12-10 MED ORDER — DIPHENHYDRAMINE HCL 12.5 MG/5ML PO ELIX
12.5000 mg | ORAL_SOLUTION | ORAL | Status: DC | PRN
  Administered 2017-12-11: 12.5 mg via ORAL
  Filled 2017-12-10: qty 5

## 2017-12-10 MED ORDER — DOCUSATE SODIUM 100 MG PO CAPS: 100.0000 mg | ORAL_CAPSULE | Freq: Two times a day (BID) | ORAL | 0 refills | Status: DC

## 2017-12-10 MED ORDER — FLUTICASONE PROPIONATE 50 MCG/ACT NA SUSP: 1.0000 | Freq: Every day | NASAL | Status: DC | PRN

## 2017-12-10 MED ORDER — METOCLOPRAMIDE HCL 5 MG/ML IJ SOLN: 5.0000 mg | Freq: Three times a day (TID) | INTRAMUSCULAR | Status: DC | PRN

## 2017-12-10 MED ORDER — ONDANSETRON HCL 4 MG PO TABS: 4.0000 mg | ORAL_TABLET | Freq: Four times a day (QID) | ORAL | Status: DC | PRN

## 2017-12-10 MED ORDER — PHENYLEPHRINE 40 MCG/ML (10ML) SYRINGE FOR IV PUSH (FOR BLOOD PRESSURE SUPPORT)
PREFILLED_SYRINGE | INTRAVENOUS | Status: AC
  Filled 2017-12-10: qty 10

## 2017-12-10 MED ORDER — ASPIRIN 81 MG PO CHEW
81.0000 mg | CHEWABLE_TABLET | Freq: Two times a day (BID) | ORAL | Status: DC
  Administered 2017-12-10 – 2017-12-11 (×2): 81 mg via ORAL
  Filled 2017-12-10 (×3): qty 1

## 2017-12-10 MED ORDER — MAGNESIUM CITRATE PO SOLN: 1.0000 | Freq: Once | ORAL | Status: DC | PRN

## 2017-12-10 MED ORDER — STERILE WATER FOR IRRIGATION IR SOLN
Status: DC | PRN
  Administered 2017-12-10: 2000 mL

## 2017-12-10 MED ORDER — CELECOXIB 200 MG PO CAPS
200.0000 mg | ORAL_CAPSULE | Freq: Two times a day (BID) | ORAL | Status: DC
  Administered 2017-12-10 – 2017-12-11 (×2): 200 mg via ORAL
  Filled 2017-12-10 (×3): qty 1

## 2017-12-10 MED ORDER — DEXAMETHASONE SODIUM PHOSPHATE 10 MG/ML IJ SOLN
10.0000 mg | Freq: Once | INTRAMUSCULAR | Status: AC
  Administered 2017-12-11: 10 mg via INTRAVENOUS
  Filled 2017-12-10: qty 1

## 2017-12-10 MED ORDER — BISACODYL 10 MG RE SUPP: 10.0000 mg | Freq: Every day | RECTAL | Status: DC | PRN

## 2017-12-10 MED ORDER — POLYETHYLENE GLYCOL 3350 17 G PO PACK
17.0000 g | PACK | Freq: Two times a day (BID) | ORAL | Status: DC
  Administered 2017-12-10 – 2017-12-11 (×2): 17 g via ORAL
  Filled 2017-12-10 (×3): qty 1

## 2017-12-10 MED ORDER — POLYETHYLENE GLYCOL 3350 17 G PO PACK: 17.0000 g | PACK | Freq: Two times a day (BID) | ORAL | 0 refills | Status: DC

## 2017-12-10 MED ORDER — METHOCARBAMOL 500 MG PO TABS
500.0000 mg | ORAL_TABLET | Freq: Four times a day (QID) | ORAL | Status: DC | PRN
  Administered 2017-12-10: 500 mg via ORAL
  Filled 2017-12-10: qty 1

## 2017-12-10 MED ORDER — FENTANYL CITRATE (PF) 100 MCG/2ML IJ SOLN
INTRAMUSCULAR | Status: AC
  Filled 2017-12-10: qty 2

## 2017-12-10 MED ORDER — HYDROCODONE-ACETAMINOPHEN 7.5-325 MG PO TABS: 1.0000 | ORAL_TABLET | ORAL | Status: DC | PRN

## 2017-12-10 MED ORDER — ONDANSETRON HCL 4 MG/2ML IJ SOLN
INTRAMUSCULAR | Status: DC | PRN
  Administered 2017-12-10: 4 mg via INTRAVENOUS

## 2017-12-10 MED ORDER — PROPOFOL 500 MG/50ML IV EMUL
INTRAVENOUS | Status: DC | PRN
  Administered 2017-12-10: 75 ug/kg/min via INTRAVENOUS

## 2017-12-10 MED ORDER — LACTATED RINGERS IV SOLN
INTRAVENOUS | Status: DC
  Administered 2017-12-10 (×2): via INTRAVENOUS

## 2017-12-10 MED ORDER — ONDANSETRON HCL 4 MG/2ML IJ SOLN: 4.0000 mg | Freq: Four times a day (QID) | INTRAMUSCULAR | Status: DC | PRN

## 2017-12-10 SURGICAL SUPPLY — 43 items
BAG ZIPLOCK 12X15 (MISCELLANEOUS) ×2 IMPLANT
BLADE SAG 18X100X1.27 (BLADE) ×2 IMPLANT
CHLORAPREP W/TINT 26ML (MISCELLANEOUS) ×2 IMPLANT
COVER PERINEAL POST (MISCELLANEOUS) ×2 IMPLANT
COVER SURGICAL LIGHT HANDLE (MISCELLANEOUS) ×2 IMPLANT
COVER WAND RF STERILE (DRAPES) ×2 IMPLANT
CUP ACETBLR 54 OD PINNACLE (Hips) ×2 IMPLANT
DERMABOND ADVANCED (GAUZE/BANDAGES/DRESSINGS) ×1
DERMABOND ADVANCED .7 DNX12 (GAUZE/BANDAGES/DRESSINGS) ×1 IMPLANT
DRAPE EENT ADH APERT 31X51 STR (DRAPES) ×2 IMPLANT
DRAPE U-SHAPE 47X51 STRL (DRAPES) ×4 IMPLANT
DRESSING AQUACEL AG SP 3.5X10 (GAUZE/BANDAGES/DRESSINGS) ×1 IMPLANT
DRSG AQUACEL AG SP 3.5X10 (GAUZE/BANDAGES/DRESSINGS) ×2
ELECT REM PT RETURN 15FT ADLT (MISCELLANEOUS) ×2 IMPLANT
ELIMINATOR HOLE APEX DEPUY (Hips) ×2 IMPLANT
FEM STEM 12/14 TAPER SZ 4 HIP (Orthopedic Implant) ×2 IMPLANT
FEMORAL STEM 12/14 TPR SZ4 HIP (Orthopedic Implant) ×1 IMPLANT
GLOVE BIOGEL M STRL SZ7.5 (GLOVE) ×4 IMPLANT
GLOVE BIOGEL PI IND STRL 7.0 (GLOVE) ×2 IMPLANT
GLOVE BIOGEL PI IND STRL 7.5 (GLOVE) ×5 IMPLANT
GLOVE BIOGEL PI IND STRL 8.5 (GLOVE) ×1 IMPLANT
GLOVE BIOGEL PI INDICATOR 7.0 (GLOVE) ×2
GLOVE BIOGEL PI INDICATOR 7.5 (GLOVE) ×5
GLOVE BIOGEL PI INDICATOR 8.5 (GLOVE) ×1
GLOVE ECLIPSE 8.0 STRL XLNG CF (GLOVE) ×4 IMPLANT
GLOVE ORTHO TXT STRL SZ7.5 (GLOVE) ×2 IMPLANT
GLOVE SURG SS PI 7.5 STRL IVOR (GLOVE) ×2 IMPLANT
GOWN STRL REUS W/TWL 2XL LVL3 (GOWN DISPOSABLE) ×2 IMPLANT
GOWN STRL REUS W/TWL LRG LVL3 (GOWN DISPOSABLE) ×4 IMPLANT
GOWN STRL REUS W/TWL XL LVL3 (GOWN DISPOSABLE) ×2 IMPLANT
HEAD CERAMIC DELTA 36 PLUS 1.5 (Hips) ×2 IMPLANT
HOLDER FOLEY CATH W/STRAP (MISCELLANEOUS) ×2 IMPLANT
LINER NEUTRAL 54X36MM PLUS 4 (Hips) ×2 IMPLANT
PACK ANTERIOR HIP CUSTOM (KITS) ×2 IMPLANT
SCREW 6.5MMX25MM (Screw) ×2 IMPLANT
SUT MNCRL AB 4-0 PS2 18 (SUTURE) ×2 IMPLANT
SUT STRATAFIX 0 PDS 27 VIOLET (SUTURE) ×2
SUT VIC AB 1 CT1 36 (SUTURE) ×6 IMPLANT
SUT VIC AB 2-0 CT1 27 (SUTURE) ×2
SUT VIC AB 2-0 CT1 TAPERPNT 27 (SUTURE) ×2 IMPLANT
SUTURE STRATFX 0 PDS 27 VIOLET (SUTURE) ×1 IMPLANT
TRAY FOLEY MTR SLVR 16FR STAT (SET/KITS/TRAYS/PACK) ×2 IMPLANT
YANKAUER SUCT BULB TIP 10FT TU (MISCELLANEOUS) ×2 IMPLANT

## 2017-12-10 NOTE — Anesthesia Postprocedure Evaluation (Signed)
Anesthesia Post Note  Patient: Collin Crosby  Procedure(s) Performed: LEFT TOTAL HIP ARTHROPLASTY ANTERIOR APPROACH (Left Hip)     Patient location during evaluation: PACU Anesthesia Type: MAC Level of consciousness: awake and alert Pain management: pain level controlled Vital Signs Assessment: post-procedure vital signs reviewed and stable Respiratory status: spontaneous breathing and respiratory function stable Cardiovascular status: blood pressure returned to baseline and stable Postop Assessment: spinal receding Anesthetic complications: no    Last Vitals:  Vitals:   12/10/17 1410 12/10/17 1507  BP: 121/82 113/79  Pulse: (!) 54 (!) 56  Resp: 16   Temp:  36.8 C  SpO2: 100% 100%    Last Pain:  Vitals:   12/10/17 1507  TempSrc: Oral  PainSc:                  Tiajuana Amass

## 2017-12-10 NOTE — Op Note (Signed)
NAME:  Collin Crosby                ACCOUNT NO.: [id number]      MEDICAL RECORD NO.: [id number]      FACILITY:  [facility]      PHYSICIAN:  Shelda Pal  DATE OF BIRTH:  Jul 20, 1965     DATE OF PROCEDURE:  12/10/2017                                 OPERATIVE REPORT         PREOPERATIVE DIAGNOSIS: Left  hip osteoarthritis.      POSTOPERATIVE DIAGNOSIS:  Left hip osteoarthritis.      PROCEDURE:  Left total hip replacement through an anterior approach   utilizing DePuy THR system, component size 54mm pinnacle cup, a size 36+4 neutral   Altrex liner, a size 4 hi Actis femoral stem with a 36+1.5 delta ceramic   ball.      SURGEON:  Madlyn Frankel. Charlann Boxer, M.D.      ASSISTANT:  Skip Mayer, PA-C     ANESTHESIA:  Spinal.      SPECIMENS:  None.      COMPLICATIONS:  None.      BLOOD LOSS:  400 cc     DRAINS:  None.      INDICATION OF THE PROCEDURE:  Collin Crosby is a 52 y.o. male who had   presented to office for evaluation of left hip pain.  Radiographs revealed   progressive degenerative changes with bone-on-bone   articulation of the  hip joint, including subchondral cystic changes and osteophytes.  The patient had painful limited range of   motion significantly affecting their overall quality of life and function.  The patient was failing to    respond to conservative measures including medications and/or injections and activity modification and at this point was ready   to proceed with more definitive measures.  Consent was obtained for   benefit of pain relief.  Specific risks of infection, DVT, component   failure, dislocation, neurovascular injury, and need for revision surgery were reviewed in the office as well discussion of   the anterior versus posterior approach were reviewed.     PROCEDURE IN DETAIL:  The patient was brought to operative theater.   Once adequate anesthesia, preoperative antibiotics, 2 gm of Ancef, 1 gm of Tranexamic Acid,  and 10 mg of Decadron were administered, the patient was positioned supine on the [employer] table.  Once the patient was safely positioned with adequate padding of boney prominences we predraped out the hip, and used fluoroscopy to confirm orientation of the pelvis.      The left hip was then prepped and draped from proximal iliac crest to   mid thigh with a shower curtain technique.      Time-out was performed identifying the patient, planned procedure, and the appropriate extremity.     An incision was then made 2 cm lateral to the   anterior superior iliac spine extending over the orientation of the   tensor fascia lata muscle and sharp dissection was carried down to the   fascia of the muscle.      The fascia was then incised.  The muscle belly was identified and swept   laterally and retractor placed along the superior neck.  Following   cauterization of the circumflex vessels and removing some  pericapsular   fat, a second cobra retractor was placed on the inferior neck.  A T-capsulotomy was made along the line of the   superior neck to the trochanteric fossa, then extended proximally and   distally.  Tag sutures were placed and the retractors were then placed   intracapsular.  We then identified the trochanteric fossa and   orientation of my neck cut and then made a neck osteotomy with the femur on traction.  The femoral   head was removed without difficulty or complication.  Traction was let   off and retractors were placed posterior and anterior around the   acetabulum.      The labrum and foveal tissue were debrided.  I began reaming with a 46 mm   reamer and reamed up to 53 mm reamer with good bony bed preparation and a 54 mm  cup was chosen.  The final 54 mm Pinnacle cup was then impacted under fluoroscopy to confirm the depth of penetration and orientation with respect to   Abduction and forward flexion.  A screw was placed into the ilium followed by the hole eliminator.  The  final   36+4 neutral Altrex liner was impacted with good visualized rim fit.  The cup was positioned anatomically within the acetabular portion of the pelvis.      At this point, the femur was rolled to 100 degrees.  Further capsule was   released off the inferior aspect of the femoral neck.  I then   released the superior capsule proximally.  With the leg in a neutral position the hook was placed laterally   along the femur under the vastus lateralis origin and elevated manually and then held in position using the hook attachment on the bed.  The leg was then extended and adducted with the leg rolled to 100   degrees of external rotation.  Retractors were placed along the medial calcar and posteriorly over the greater trochanter.  Once the proximal femur was fully   exposed, I used a box osteotome to set orientation.  I then began   broaching with the starting chili pepper broach and passed this by hand and then broached up to 4.  With the 4 broach in place I chose a high offset neck and did several trial reductions.  The offset was appropriate, leg lengths   appeared to be equal best matched with the +1.5 head ball trial confirmed radiographically.   Given these findings, I went ahead and dislocated the hip, repositioned all   retractors and positioned the right hip in the extended and abducted position.  The final 4 Hi Actis femoral stem was   chosen and it was impacted down to the level of neck cut.  Based on this   and the trial reductions, a final 36+1.5 delta ceramic ball was chosen and   impacted onto a clean and dry trunnion, and the hip was reduced.  The   hip had been irrigated throughout the case again at this point.  I did   reapproximate the superior capsular leaflet to the anterior leaflet   using #1 Vicryl.  The fascia of the   tensor fascia lata muscle was then reapproximated using #1 Vicryl and #0 Stratafix sutures.  The   remaining wound was closed with 2-0 Vicryl and running  4-0 Monocryl.   The hip was cleaned, dried, and dressed sterilely using Dermabond and   Aquacel dressing.  The patient was then brought  to recovery room in stable condition tolerating the procedure well.    Skip Mayer, PA-C was present for the entirety of the case involved from   preoperative positioning, perioperative retractor management, general   facilitation of the case, as well as primary wound closure as assistant.            Madlyn Frankel Charlann Boxer, M.D.        12/10/2017 12:23 PM

## 2017-12-10 NOTE — Anesthesia Procedure Notes (Signed)
Spinal  Start time: 12/10/2017 11:02 AM End time: 12/10/2017 11:06 AM Staffing Anesthesiologist: Marcene Duos, MD Performed: anesthesiologist  Preanesthetic Checklist Completed: patient identified, site marked, surgical consent, pre-op evaluation, timeout performed, IV checked, risks and benefits discussed and monitors and equipment checked Spinal Block Patient position: sitting Prep: site prepped and draped and DuraPrep Patient monitoring: heart rate, continuous pulse ox and blood pressure Approach: midline Location: L4-5 Injection technique: single-shot Needle Needle type: Pencan  Needle gauge: 24 G Needle length: 9 cm

## 2017-12-10 NOTE — Anesthesia Preprocedure Evaluation (Signed)
Anesthesia Evaluation  Patient identified by MRN, date of birth, ID band Patient awake    Reviewed: Allergy & Precautions, NPO status , Patient's Chart, lab work & pertinent test results  Airway Mallampati: II  TM Distance: >3 FB Neck ROM: Full    Dental   Pulmonary neg pulmonary ROS,    breath sounds clear to auscultation       Cardiovascular negative cardio ROS   Rhythm:Regular Rate:Normal     Neuro/Psych negative neurological ROS     GI/Hepatic negative GI ROS, Neg liver ROS,   Endo/Other  negative endocrine ROS  Renal/GU negative Renal ROS     Musculoskeletal   Abdominal   Peds  Hematology negative hematology ROS (+)   Anesthesia Other Findings   Reproductive/Obstetrics                             Lab Results  Component Value Date   WBC 4.9 12/03/2017   HGB 16.3 12/03/2017   HCT 47.1 12/03/2017   MCV 90.4 12/03/2017   PLT 193 12/03/2017   Lab Results  Component Value Date   CREATININE 0.93 12/03/2017   BUN 13 12/03/2017   NA 143 12/03/2017   K 4.3 12/03/2017   CL 108 12/03/2017   CO2 27 12/03/2017    Anesthesia Physical Anesthesia Plan  ASA: I  Anesthesia Plan: MAC and Spinal   Post-op Pain Management:    Induction: Intravenous  PONV Risk Score and Plan: 1 and Propofol infusion, Ondansetron and Treatment may vary due to age or medical condition  Airway Management Planned: Natural Airway and Simple Face Mask  Additional Equipment:   Intra-op Plan:   Post-operative Plan:   Informed Consent: I have reviewed the patients History and Physical, chart, labs and discussed the procedure including the risks, benefits and alternatives for the proposed anesthesia with the patient or authorized representative who has indicated his/her understanding and acceptance.     Plan Discussed with: CRNA  Anesthesia Plan Comments:         Anesthesia Quick Evaluation

## 2017-12-10 NOTE — Evaluation (Signed)
Physical Therapy Evaluation Patient Details Name: Collin Crosby MRN: [id number] DOB: March 25, 1965 Today's Date: 12/10/2017   History of Present Illness  Pt is a 52 YO male s/p L DA-THA on 12/10/17. PMH and surgical history not significant.   Clinical Impression  Pt presents with L hip pain, difficulty performing mobility tasks, and decreased tolerance for ambulation secondary to feeling faint. Pt to benefit from continued acute PT. Pt ambulated 35 ft with RW with min guard assist. When pt felt faint in hallway, PT had to strongly encourage pt to sit due to determination to progress mobility. PT to continue to follow, and will progress mobility as tolerated.     Follow Up Recommendations Follow surgeon's recommendation for DC plan and follow-up therapies;Supervision for mobility/OOB(no PT follow up )    Equipment Recommendations  None recommended by PT    Recommendations for Other Services       Precautions / Restrictions Precautions Precautions: Fall Restrictions Weight Bearing Restrictions: No Other Position/Activity Restrictions: WBAT       Mobility  Bed Mobility Overal bed mobility: Needs Assistance Bed Mobility: Supine to Sit     Supine to sit: Min assist;HOB elevated     General bed mobility comments: Min assist for LLE management and trunk elevation. Pt with self-steadying at EOB.   Transfers Overall transfer level: Needs assistance Equipment used: Rolling walker (2 wheeled) Transfers: Sit to/from Stand Sit to Stand: Min guard;From elevated surface         General transfer comment: Min guard for safety. Verbal cuing for hand placement.   Ambulation/Gait Ambulation/Gait assistance: Min guard Gait Distance (Feet): 35 Feet Assistive device: Rolling walker (2 wheeled) Gait Pattern/deviations: Step-to pattern;Decreased stride length;Decreased weight shift to left;Trunk flexed Gait velocity: decr    General Gait Details: Min guard for safety, verbal cuing  for placement in RW and sequencing. At 35 ft ambulation, pt stated he felt hot, sweaty, and woozy. NT quickly retrieved recliner and pt sat. Further ambulation discontinued.   Stairs            Wheelchair Mobility    Modified Rankin (Stroke Patients Only)       Balance Overall balance assessment: Mild deficits observed, not formally tested                                           Pertinent Vitals/Pain Pain Assessment: 0-10 Pain Score: 4  Pain Location: L hip, during ambulation  Pain Descriptors / Indicators: Sore Pain Intervention(s): Limited activity within patient's tolerance;Monitored during session;Ice applied;Repositioned    Home Living Family/patient expects to be discharged to:: Private residence Living Arrangements: Children Available Help at Discharge: Family;Available PRN/intermittently Type of Home: House Home Access: Stairs to enter Entrance Stairs-Rails: Lawyer of Steps: 6 Home Layout: One level Home Equipment: Walker - 2 wheels;Cane - single point;Shower seat      Prior Function Level of Independence: Independent               Hand Dominance   Dominant Hand: Right    Extremity/Trunk Assessment   Upper Extremity Assessment Upper Extremity Assessment: Overall WFL for tasks assessed    Lower Extremity Assessment Lower Extremity Assessment: Overall WFL for tasks assessed;LLE deficits/detail LLE Deficits / Details: Suspected hip weakness; able to perform SLR with assist, quad set x1, ankle pumps  LLE Sensation: WNL    Cervical / Trunk  Assessment Cervical / Trunk Assessment: Normal  Communication   Communication: No difficulties  Cognition Arousal/Alertness: Awake/alert Behavior During Therapy: WFL for tasks assessed/performed Overall Cognitive Status: Within Functional Limits for tasks assessed                                        General Comments      Exercises  Total Joint Exercises Ankle Circles/Pumps: AROM;Both;5 reps;Seated   Assessment/Plan    PT Assessment Patient needs continued PT services  PT Problem List Decreased strength;Pain;Decreased range of motion;Decreased activity tolerance;Decreased knowledge of use of DME;Decreased balance;Decreased safety awareness;Decreased mobility       PT Treatment Interventions DME instruction;Therapeutic activities;Gait training;Therapeutic exercise;Patient/family education;Stair training;Balance training;Functional mobility training    PT Goals (Current goals can be found in the Care Plan section)  Acute Rehab PT Goals Patient Stated Goal: go home  PT Goal Formulation: With patient Time For Goal Achievement: 12/17/17 Potential to Achieve Goals: Good    Frequency 7X/week   Barriers to discharge        Co-evaluation               AM-PAC PT "6 Clicks" Daily Activity  Outcome Measure Difficulty turning over in bed (including adjusting bedclothes, sheets and blankets)?: Unable Difficulty moving from lying on back to sitting on the side of the bed? : Unable Difficulty sitting down on and standing up from a chair with arms (e.g., wheelchair, bedside commode, etc,.)?: Unable Help needed moving to and from a bed to chair (including a wheelchair)?: A Little Help needed walking in hospital room?: A Little Help needed climbing 3-5 steps with a railing? : A Little 6 Click Score: 12    End of Session Equipment Utilized During Treatment: Gait belt Activity Tolerance: Other (comment)(Limited by feeling faint ) Patient left: with chair alarm set;in chair;with call bell/phone within reach;with family/visitor present;with SCD's reapplied Nurse Communication: Mobility status PT Visit Diagnosis: Other abnormalities of gait and mobility (R26.89);Difficulty in walking, not elsewhere classified (R26.2)    Time: 1842-1910 PT Time Calculation (min) (ACUTE ONLY): 28 min   Charges:   PT Evaluation $PT  Eval Low Complexity: 1 Low PT Treatments $Gait Training: 8-22 mins       Nicola Police, PT Acute Rehabilitation Services Pager [phone]  Office [phone]   Mattilynn Forrer D Despina Hidden 12/10/2017, 8:46 PM

## 2017-12-10 NOTE — Interval H&P Note (Signed)
History and Physical Interval Note:  12/10/2017 9:59 AM  Collin Crosby  has presented today for surgery, with the diagnosis of Left hip osteoarthritis  The various methods of treatment have been discussed with the patient and family. After consideration of risks, benefits and other options for treatment, the patient has consented to  Procedure(s) with comments: LEFT TOTAL HIP ARTHROPLASTY ANTERIOR APPROACH (Left) - 70 mins as a surgical intervention .  The patient's history has been reviewed, patient examined, no change in status, stable for surgery.  I have reviewed the patient's chart and labs.  Questions were answered to the patient's satisfaction.     Shelda Pal

## 2017-12-10 NOTE — Transfer of Care (Signed)
Immediate Anesthesia Transfer of Care Note  Patient: Collin Crosby  Procedure(s) Performed: Procedure(s) with comments: LEFT TOTAL HIP ARTHROPLASTY ANTERIOR APPROACH (Left) - 70 mins  Patient Location: PACU  Anesthesia Type:Spinal  Level of Consciousness:  sedated, patient cooperative and responds to stimulation  Airway & Oxygen Therapy:Patient Spontanous Breathing and Patient connected to face mask oxgen  Post-op Assessment:  Report given to PACU RN and Post -op Vital signs reviewed and stable  Post vital signs:  Reviewed and stable  Last Vitals:  Vitals:   12/10/17 0851  BP: (!) 121/91  Pulse: 60  Resp: 18  Temp: 37.1 C  SpO2: 100%    Complications: No apparent anesthesia complications

## 2017-12-10 NOTE — Discharge Instructions (Signed)

## 2017-12-11 ENCOUNTER — Encounter (HOSPITAL_COMMUNITY): Payer: Self-pay | Admitting: Orthopedic Surgery

## 2017-12-11 LAB — BASIC METABOLIC PANEL
Anion gap: 7 (ref 5–15)
BUN: 15 mg/dL (ref 6–20)
CO2: 24 mmol/L (ref 22–32)
Calcium: 8.8 mg/dL — ABNORMAL LOW (ref 8.9–10.3)
Chloride: 108 mmol/L (ref 98–111)
Creatinine, Ser: 0.99 mg/dL (ref 0.61–1.24)
GFR calc Af Amer: >60 mL/min (ref >60)
GFR calc non Af Amer: >60 mL/min (ref >60)
GLUCOSE: 212 mg/dL — AB (ref 70–99)
POTASSIUM: 4.3 mmol/L (ref 3.5–5.1)
Sodium: 139 mmol/L (ref 135–145)

## 2017-12-11 LAB — CBC
HEMATOCRIT: 40.5 % (ref 39.0–52.0)
HEMOGLOBIN: 13.9 g/dL (ref 13.0–17.0)
MCH: 31.1 pg (ref 26.0–34.0)
MCHC: 34.3 g/dL (ref 30.0–36.0)
MCV: 90.6 fL (ref 80.0–100.0)
NRBC: 0 % (ref 0.0–0.2)
Platelets: 206 10*3/uL (ref 150–400)
RBC: 4.47 MIL/uL (ref 4.22–5.81)
RDW: 11.9 % (ref 11.5–15.5)
WBC: 14.1 10*3/uL — ABNORMAL HIGH (ref 4.0–10.5)

## 2017-12-11 NOTE — Progress Notes (Signed)
Physical Therapy Treatment Patient Details Name: Collin Crosby MRN: [id number] DOB: 23-Oct-1965 Today's Date: 12/11/2017    History of Present Illness Pt is a 52 YO male s/p L DA-THA on 12/10/17. PMH and surgical history not significant.     PT Comments    Pt progressing well with mobility and eager for dc home.  Reviewed car transfers, stairs and home therex program with progression and written instruction provided.   Follow Up Recommendations  Follow surgeon's recommendation for DC plan and follow-up therapies;Supervision for mobility/OOB     Equipment Recommendations  None recommended by PT    Recommendations for Other Services       Precautions / Restrictions Precautions Precautions: Fall Restrictions Weight Bearing Restrictions: No Other Position/Activity Restrictions: WBAT     Mobility  Bed Mobility Overal bed mobility: Needs Assistance Bed Mobility: Supine to Sit     Supine to sit: Supervision     General bed mobility comments: Pt up in chair and requests back to same  Transfers Overall transfer level: Needs assistance Equipment used: Rolling walker (2 wheeled) Transfers: Sit to/from Stand Sit to Stand: Supervision         General transfer comment: Pt self cues  Ambulation/Gait Ambulation/Gait assistance: Supervision Gait Distance (Feet): 200 Feet Assistive device: Rolling walker (2 wheeled) Gait Pattern/deviations: Decreased stride length;Decreased weight shift to left;Trunk flexed;Step-to pattern;Step-through pattern Gait velocity: decr    General Gait Details: min cues for posture and position from [employer]             Wheelchair Mobility    Modified Rankin (Stroke Patients Only)       Balance Overall balance assessment: Mild deficits observed, not formally tested                                          Cognition Arousal/Alertness: Awake/alert Behavior During Therapy: WFL for tasks  assessed/performed Overall Cognitive Status: Within Functional Limits for tasks assessed                                        Exercises Total Joint Exercises Ankle Circles/Pumps: AROM;Both;Seated;20 reps Quad Sets: AROM;Both;10 reps;Supine Heel Slides: AAROM;Left;20 reps;Supine Hip ABduction/ADduction: AAROM;Left;15 reps;Supine Long Arc Quad: AROM;Left;10 reps;Seated    General Comments        Pertinent Vitals/Pain Pain Assessment: 0-10 Pain Score: 2  Pain Location: L hip, during ambulation  Pain Descriptors / Indicators: Sore Pain Intervention(s): Limited activity within patient's tolerance;Monitored during session;Premedicated before session;Ice applied    Home Living                      Prior Function            PT Goals (current goals can now be found in the care plan section) Acute Rehab PT Goals Patient Stated Goal: go home  PT Goal Formulation: With patient Time For Goal Achievement: 12/17/17 Potential to Achieve Goals: Good Progress towards PT goals: Progressing toward goals    Frequency    7X/week      PT Plan Current plan remains appropriate    Co-evaluation              AM-PAC PT "6 Clicks" Daily Activity  Outcome Measure  Difficulty turning over in bed (including  adjusting bedclothes, sheets and blankets)?: A Lot Difficulty moving from lying on back to sitting on the side of the bed? : A Lot Difficulty sitting down on and standing up from a chair with arms (e.g., wheelchair, bedside commode, etc,.)?: A Little Help needed moving to and from a bed to chair (including a wheelchair)?: A Little Help needed walking in hospital room?: None Help needed climbing 3-5 steps with a railing? : A Little 6 Click Score: 17    End of Session Equipment Utilized During Treatment: Gait belt Activity Tolerance: Patient tolerated treatment well Patient left: in chair;with call bell/phone within reach;with chair alarm set;with  family/visitor present Nurse Communication: Mobility status PT Visit Diagnosis: Other abnormalities of gait and mobility (R26.89);Difficulty in walking, not elsewhere classified (R26.2)     Time: 4098-1191 PT Time Calculation (min) (ACUTE ONLY): 29 min  Charges:  $Gait Training: 8-22 mins $Therapeutic Exercise: 8-22 mins                     [name] PT Acute Rehabilitation Services Pager [phone] Office [phone]    Shephanie Romas 12/11/2017, 1:25 PM

## 2017-12-11 NOTE — Progress Notes (Signed)
Physical Therapy Treatment Patient Details Name: Collin Crosby MRN: [id number] DOB: May 04, 1965 Today's Date: 12/11/2017    History of Present Illness Pt is a 52 YO male s/p L DA-THA on 12/10/17. PMH and surgical history not significant.     PT Comments    Pt very motivated and progressing well with mobility.  Initiated home therex program and ambulated increased distance this session.   Follow Up Recommendations  Follow surgeon's recommendation for DC plan and follow-up therapies;Supervision for mobility/OOB     Equipment Recommendations  None recommended by PT    Recommendations for Other Services       Precautions / Restrictions Precautions Precautions: Fall Restrictions Weight Bearing Restrictions: No Other Position/Activity Restrictions: WBAT     Mobility  Bed Mobility Overal bed mobility: Needs Assistance Bed Mobility: Supine to Sit     Supine to sit: Supervision        Transfers Overall transfer level: Needs assistance Equipment used: Rolling walker (2 wheeled) Transfers: Sit to/from Stand Sit to Stand: Min guard;Supervision         General transfer comment: Min guard for safety. Verbal cuing for hand placement.   Ambulation/Gait Ambulation/Gait assistance: Min guard;Supervision Gait Distance (Feet): 200 Feet Assistive device: Rolling walker (2 wheeled) Gait Pattern/deviations: Decreased stride length;Decreased weight shift to left;Trunk flexed;Step-to pattern;Step-through pattern Gait velocity: decr    General Gait Details: cues for posture, position from RW, ER on L and initial sequence   Stairs             Wheelchair Mobility    Modified Rankin (Stroke Patients Only)       Balance Overall balance assessment: Mild deficits observed, not formally tested                                          Cognition Arousal/Alertness: Awake/alert Behavior During Therapy: WFL for tasks assessed/performed Overall  Cognitive Status: Within Functional Limits for tasks assessed                                        Exercises Total Joint Exercises Ankle Circles/Pumps: AROM;Both;Seated;20 reps Quad Sets: AROM;Both;10 reps;Supine Heel Slides: AAROM;Left;20 reps;Supine Hip ABduction/ADduction: AAROM;Left;15 reps;Supine    General Comments        Pertinent Vitals/Pain Pain Assessment: 0-10 Pain Score: 2  Pain Location: L hip, during ambulation  Pain Descriptors / Indicators: Sore Pain Intervention(s): Limited activity within patient's tolerance;Monitored during session;Premedicated before session;Ice applied    Home Living                      Prior Function            PT Goals (current goals can now be found in the care plan section) Acute Rehab PT Goals Patient Stated Goal: go home  PT Goal Formulation: With patient Time For Goal Achievement: 12/17/17 Potential to Achieve Goals: Good Progress towards PT goals: Progressing toward goals    Frequency    7X/week      PT Plan Current plan remains appropriate    Co-evaluation              AM-PAC PT "6 Clicks" Daily Activity  Outcome Measure  Difficulty turning over in bed (including adjusting bedclothes, sheets and blankets)?: A Lot Difficulty moving  from lying on back to sitting on the side of the bed? : A Lot Difficulty sitting down on and standing up from a chair with arms (e.g., wheelchair, bedside commode, etc,.)?: A Lot Help needed moving to and from a bed to chair (including a wheelchair)?: A Little Help needed walking in hospital room?: A Little Help needed climbing 3-5 steps with a railing? : A Little 6 Click Score: 15    End of Session Equipment Utilized During Treatment: Gait belt Activity Tolerance: Patient tolerated treatment well Patient left: in chair;with call bell/phone within reach;with chair alarm set;with family/visitor present Nurse Communication: Mobility status PT Visit  Diagnosis: Other abnormalities of gait and mobility (R26.89);Difficulty in walking, not elsewhere classified (R26.2)     Time: 1610-9604 PT Time Calculation (min) (ACUTE ONLY): 28 min  Charges:  $Gait Training: 8-22 mins $Therapeutic Exercise: 8-22 mins                     [name] PT Acute Rehabilitation Services Pager [phone] Office [phone]    [name] 12/11/2017, 11:38 AM

## 2017-12-11 NOTE — Progress Notes (Signed)
     Subjective: 1 Day Post-Op Procedure(s) (LRB): LEFT TOTAL HIP ARTHROPLASTY ANTERIOR APPROACH (Left)   Patient reports pain as mild, pain controlled. No events throughout the night. States that he did have a BM last night. Looking forward to recovering.  Ready to be discharged home.    Objective:   VITALS:   Vitals:   12/11/17 0443 12/11/17 0859  BP: 112/68 117/79  Pulse: 65 68  Resp: 20 14  Temp: 98.3 F (36.8 C) 97.6 F (36.4 C)  SpO2: 97% 100%    Dorsiflexion/Plantar flexion intact Incision: dressing C/D/I No cellulitis present Compartment soft  LABS Recent Labs    12/11/17 0519  HGB 13.9  HCT 40.5  WBC 14.1*  PLT 206    Recent Labs    12/11/17 0519  NA 139  K 4.3  BUN 15  CREATININE 0.99  GLUCOSE 212*     Assessment/Plan: 1 Day Post-Op Procedure(s) (LRB): LEFT TOTAL HIP ARTHROPLASTY ANTERIOR APPROACH (Left) Foley cath d/c'ed Advance diet Up with therapy D/C IV fluids Discharge home Follow up in 2 weeks at [facility] Follow up with OLIN,Josiah Wojtaszek D in 2 weeks.  Contact information:  [facility]) [address] [place] [address] [phone]      Anastasio Auerbach. Arti Trang   PAC  12/11/2017, 9:24 AM

## 2017-12-11 NOTE — Plan of Care (Signed)
Pt is stable. Pain management in progress, effective. Plane of care reviewed.  Problem: Education: Goal: Verbalization of understanding the information provided (i.e., activity precautions, restrictions, etc) will improve Outcome: Progressing Goal: Individualized Educational Video(s) Outcome: Progressing   Problem: Activity: Goal: Ability to ambulate and perform ADLs will improve Outcome: Progressing   Problem: Clinical Measurements: Goal: Postoperative complications will be avoided or minimized Outcome: Progressing   Problem: Self-Concept: Goal: Ability to maintain and perform role responsibilities to the fullest extent possible will improve Outcome: Progressing   Problem: Pain Management: Goal: Pain level will decrease Outcome: Progressing

## 2017-12-12 NOTE — Discharge Summary (Signed)
Physician Discharge Summary  Patient ID: Collin Crosby MRN: [id number] DOB/AGE: August 01, 1965 52 y.o.  Admit date: 12/10/2017 Discharge date: 12/11/2017   Procedures:  Procedure(s) (LRB): LEFT TOTAL HIP ARTHROPLASTY ANTERIOR APPROACH (Left)  Attending Physician:  Dr. Durene Crosby   Admission Diagnoses:   Left hip primary OA / pain  Discharge Diagnoses:  Principal Problem:   S/P left THA, AA  Past Medical History:  Diagnosis Date  . Environmental allergies     HPI:    Collin Crosby, 52 y.o. male, has a history of pain and functional disability in the left hip(s) due to arthritis and patient has failed non-surgical conservative treatments for greater than 12 weeks to include NSAID's and/or analgesics and activity modification.  Onset of symptoms was gradual starting ~1 years ago with gradually worsening course since that time.The patient noted no past surgery on the left hip(s).  Patient currently rates pain in the left hip at 5 out of 10 with activity. Patient has night pain, worsening of pain with activity and weight bearing, trendelenberg gait, pain that interfers with activities of daily living and pain with passive range of motion. Patient has evidence of periarticular osteophytes and joint space narrowing by imaging studies. This condition presents safety issues increasing the risk of falls.  There is no current active infection.  Risks, benefits and expectations were discussed with the patient.  Risks including but not limited to the risk of anesthesia, blood clots, nerve damage, blood vessel damage, failure of the prosthesis, infection and up to and including death.  Patient understand the risks, benefits and expectations and wishes to proceed with surgery.   PCP: Collin Pierini, FNP   Discharged Condition: good  Hospital Course:  Patient underwent the above stated procedure on 12/10/2017. Patient tolerated the procedure well and brought to the recovery room in  good condition and subsequently to the floor.  POD #1 BP: 117/79 ; Pulse: 68 ; Temp: 97.6 F (36.4 C) ; Resp: 14 Patient reports pain as mild, pain controlled. No events throughout the night. States that he did have a BM last night. Looking forward to recovering.  Ready to be discharged home.  Dorsiflexion/plantar flexion intact, incision: dressing C/D/I, no cellulitis present and compartment soft.   LABS  Basename    HGB     13.9  HCT     40.5    Discharge Exam: General appearance: alert, cooperative and no distress Extremities: Homans sign is negative, no sign of DVT, no edema, redness or tenderness in the calves or thighs and no ulcers, gangrene or trophic changes  Disposition:  Home with follow up in 2 weeks   Follow-up Information    Collin Romans, MD. Schedule an appointment as soon as possible for a visit in 2 weeks.   Specialty:  Orthopedic Surgery Contact information: [address] [place] [address] [place] [address] [phone]           Discharge Instructions    Call MD / Call 911   Complete by:  As directed    If you experience chest pain or shortness of breath, CALL 911 and be transported to the hospital emergency room.  If you develope a fever above 101 F, pus (white drainage) or increased drainage or redness at the wound, or calf pain, call your surgeon's office.   Change dressing   Complete by:  As directed    Maintain surgical dressing until follow up in the clinic. If the edges start to pull up,  may reinforce with tape. If the dressing is no longer working, may remove and cover with gauze and tape, but must keep the area dry and clean.  Call with any questions or concerns.   Constipation Prevention   Complete by:  As directed    Drink plenty of fluids.  Prune juice may be helpful.  You may use a stool softener, such as Colace (over the counter) 100 mg twice a day.  Use MiraLax (over the counter) for constipation as needed.   Diet - low sodium heart  healthy   Complete by:  As directed    Discharge instructions   Complete by:  As directed    Maintain surgical dressing until follow up in the clinic. If the edges start to pull up, may reinforce with tape. If the dressing is no longer working, may remove and cover with gauze and tape, but must keep the area dry and clean.  Follow up in 2 weeks at [facility]. Call with any questions or concerns.   Increase activity slowly as tolerated   Complete by:  As directed    Weight bearing as tolerated with assist device (walker, cane, etc) as directed, use it as long as suggested by your surgeon or therapist, typically at least 4-6 weeks.   TED hose   Complete by:  As directed    Use stockings (TED hose) for 2 weeks on both leg(s).  You may remove them at night for sleeping.      Allergies as of 12/11/2017      Reactions   Shellfish Allergy Anaphylaxis, Itching, Swelling, Rash, Other (See Comments)   IV Dye and Shrimp   Penicillins Itching, Rash, Other (See Comments)   Has patient had a PCN reaction causing immediate rash, facial/tongue/throat swelling, SOB or lightheadedness with hypotension: No Has patient had a PCN reaction causing severe rash involving mucus membranes or skin necrosis: No Has patient had a PCN reaction that required hospitalization: No Has patient had a PCN reaction occurring within the last 10 years: No If all of the above answers are "NO", then may proceed with Cephalosporin use.      Medication List    TAKE these medications   aspirin 81 MG chewable tablet Chew 1 tablet (81 mg total) by mouth 2 (two) times daily. Take for 4 weeks, then resume regular dose.   docusate sodium 100 MG capsule Commonly known as:  COLACE Take 1 capsule (100 mg total) by mouth 2 (two) times daily.   ferrous sulfate 325 (65 FE) MG tablet Take 1 tablet (325 mg total) by mouth 3 (three) times daily with meals.   fluticasone 50 MCG/ACT nasal spray Commonly known as:   FLONASE Place 1 spray into both nostrils daily as needed for allergies or rhinitis.   HYDROcodone-acetaminophen 7.5-325 MG tablet Commonly known as:  NORCO Take 1-2 tablets by mouth every 4 (four) hours as needed for moderate pain.   HYDROcodone-acetaminophen 7.5-325 MG tablet Commonly known as:  NORCO Take 1-2 tablets by mouth every 4 (four) hours as needed for moderate pain. Start taking on:  12/16/2017   methocarbamol 500 MG tablet Commonly known as:  ROBAXIN Take 1 tablet (500 mg total) by mouth every 6 (six) hours as needed for muscle spasms.   polyethylene glycol packet Commonly known as:  MIRALAX / GLYCOLAX Take 17 g by mouth 2 (two) times daily.            Discharge Care Instructions  (From admission, onward)  Start     Ordered   12/11/17 0000  Change dressing    Comments:  Maintain surgical dressing until follow up in the clinic. If the edges start to pull up, may reinforce with tape. If the dressing is no longer working, may remove and cover with gauze and tape, but must keep the area dry and clean.  Call with any questions or concerns.   12/11/17 1610           Signed: Anastasio Auerbach. Eran Windish   PA-C  12/12/2017, 9:43 AM

## 2018-01-29 ENCOUNTER — Telehealth: Payer: Self-pay | Admitting: Nurse Practitioner

## 2018-01-29 NOTE — Telephone Encounter (Signed)
Spoke with pt and he states since his hip surgery he has been more tired and he goes back to work 02/04/18 and is concerned about his energy level. Offered appt with MMM 4/1 at 4 but pt declined saying he would wait and see how things go.

## 2018-08-21 ENCOUNTER — Other Ambulatory Visit: Payer: Self-pay

## 2018-08-22 ENCOUNTER — Encounter: Payer: Self-pay | Admitting: Nurse Practitioner

## 2018-08-22 ENCOUNTER — Ambulatory Visit (INDEPENDENT_AMBULATORY_CARE_PROVIDER_SITE_OTHER): Payer: 59 | Admitting: Nurse Practitioner

## 2018-08-22 VITALS — BP 125/78 | HR 74 | Temp 97.9°F | Ht 71.0 in | Wt 197.0 lb

## 2018-08-22 DIAGNOSIS — N6310 Unspecified lump in the right breast, unspecified quadrant: Secondary | ICD-10-CM

## 2018-08-22 NOTE — Progress Notes (Signed)
   Subjective:    Patient ID: Collin Crosby, male    DOB: [birth year], 53 y.o.   MRN: [id number]   Chief Complaint: Breast Mass   HPI Patient found a lump on chest Busic. He said it was a little sore last week and now he feels a lump. He denies area being tender to touch.    Review of Systems  Constitutional: Negative for activity change and appetite change.  HENT: Negative.   Eyes: Negative for pain.  Respiratory: Negative for shortness of breath.   Cardiovascular: Negative for chest pain, palpitations and leg swelling.  Gastrointestinal: Negative for abdominal pain.  Endocrine: Negative for polydipsia.  Genitourinary: Negative.   Skin: Negative for rash.  Neurological: Negative for dizziness, weakness and headaches.  Hematological: Does not bruise/bleed easily.  Psychiatric/Behavioral: Negative.   All other systems reviewed and are negative.      Objective:   Physical Exam Vitals signs and nursing note reviewed.  Constitutional:      Appearance: Normal appearance.  Cardiovascular:     Rate and Rhythm: Normal rate and regular rhythm.     Heart sounds: Normal heart sounds.  Pulmonary:     Effort: Pulmonary effort is normal.     Breath sounds: Normal breath sounds.  Chest:     Breasts:        Right: Mass (3cm X2cm behind aerola) present. No swelling, bleeding, inverted nipple, nipple discharge, skin change or tenderness.        Left: Normal.  Abdominal:     General: Abdomen is flat.     Palpations: Abdomen is soft.  Skin:    General: Skin is warm and dry.  Neurological:     General: No focal deficit present.     Mental Status: He is alert and oriented to person, place, and time.  Psychiatric:        Mood and Affect: Mood normal.        Behavior: Behavior normal.   BP 125/78   Pulse 74   Temp 97.9 F (36.6 C) (Oral)   Ht 5\' 11"  (1.803 m)   Wt 197 lb (89.4 kg)   BMI 27.48 kg/m          Assessment & Plan:  Ok Edwards in today with chief  complaint of Breast Mass   1. Breast mass, right Continue to monitor for change in size or nipple discharge Will call once receive results - US BREAST COMPLETE UNI RIGHT INC AXILLA; Future  Mary-Margaret Hassell Done, FNP

## 2018-08-25 ENCOUNTER — Other Ambulatory Visit: Payer: Self-pay | Admitting: Nurse Practitioner

## 2018-08-25 DIAGNOSIS — N6310 Unspecified lump in the right breast, unspecified quadrant: Secondary | ICD-10-CM

## 2018-08-27 ENCOUNTER — Other Ambulatory Visit: Payer: Self-pay | Admitting: Nurse Practitioner

## 2018-08-27 DIAGNOSIS — N6320 Unspecified lump in the left breast, unspecified quadrant: Secondary | ICD-10-CM

## 2018-09-02 ENCOUNTER — Ambulatory Visit (HOSPITAL_COMMUNITY)
Admission: RE | Admit: 2018-09-02 | Discharge: 2018-09-02 | Disposition: A | Discharge status: Home / Self Care | Payer: 59 | Source: Ambulatory Visit | Attending: Nurse Practitioner | Admitting: Nurse Practitioner

## 2018-09-02 ENCOUNTER — Ambulatory Visit (HOSPITAL_COMMUNITY): Payer: 59

## 2018-09-02 ENCOUNTER — Other Ambulatory Visit: Payer: Self-pay

## 2018-09-02 DIAGNOSIS — N6310 Unspecified lump in the right breast, unspecified quadrant: Secondary | ICD-10-CM | POA: Diagnosis present

## 2019-02-02 ENCOUNTER — Encounter: Payer: Self-pay | Admitting: Nurse Practitioner

## 2019-02-02 ENCOUNTER — Other Ambulatory Visit: Payer: Self-pay

## 2019-02-02 ENCOUNTER — Ambulatory Visit (INDEPENDENT_AMBULATORY_CARE_PROVIDER_SITE_OTHER): Payer: 59 | Admitting: Nurse Practitioner

## 2019-02-02 DIAGNOSIS — R05 Cough: Secondary | ICD-10-CM

## 2019-02-02 DIAGNOSIS — Z20828 Contact with and (suspected) exposure to other viral communicable diseases: Secondary | ICD-10-CM | POA: Diagnosis not present

## 2019-02-02 DIAGNOSIS — Z20822 Contact with and (suspected) exposure to covid-19: Secondary | ICD-10-CM

## 2019-02-02 DIAGNOSIS — R5081 Fever presenting with conditions classified elsewhere: Secondary | ICD-10-CM | POA: Diagnosis not present

## 2019-02-02 DIAGNOSIS — R52 Pain, unspecified: Secondary | ICD-10-CM

## 2019-02-02 DIAGNOSIS — R059 Cough, unspecified: Secondary | ICD-10-CM

## 2019-02-02 NOTE — Progress Notes (Signed)
   Virtual Visit via telephone Note Due to COVID-19 pandemic this visit was conducted virtually. This visit type was conducted due to national recommendations for restrictions regarding the COVID-19 Pandemic (e.g. social distancing, sheltering in place) in an effort to limit this patient's exposure and mitigate transmission in our community. All issues noted in this document were discussed and addressed.  A physical exam was not performed with this format.  I connected with Collin Crosby on 02/02/19 at 11:35 by telephone and verified that I am speaking with the correct person using two identifiers. Collin Crosby is currently located at home and no one is currently with him during visit. The provider, Mary-Margaret Hassell Done, FNP is located in their office at time of visit.  I discussed the limitations, risks, security and privacy concerns of performing an evaluation and management service by telephone and the availability of in person appointments. I also discussed with the patient that there may be a patient responsible charge related to this service. The patient expressed understanding and agreed to proceed.   History and Present Illness:   Chief Complaint: Fever   HPI Patient calls in c/o fever and chills. Fever was 102 yesterday. Has slight cough and body aches. Was exposed a week ago with covid. He went for testing today in [place].     Review of Systems  Constitutional: Negative for diaphoresis and weight loss.  Eyes: Negative for blurred vision, double vision and pain.  Respiratory: Negative for shortness of breath.   Cardiovascular: Negative for chest pain, palpitations, orthopnea and leg swelling.  Gastrointestinal: Negative for abdominal pain.  Skin: Negative for rash.  Neurological: Negative for dizziness, sensory change, loss of consciousness, weakness and headaches.  Endo/Heme/Allergies: Negative for polydipsia. Does not bruise/bleed easily.  Psychiatric/Behavioral:  Negative for memory loss. The patient does not have insomnia.   All other systems reviewed and are negative.    Observations/Objective: Alert and oriented- answers all questions appropriately mild distress Slight cough   Assessment and Plan: .Collin Crosby in today with chief complaint of Fever   1. Fever in other diseases  2. Body aches  3. Cough  4. Encounter by telehealth for suspected COVID-19  quarantine for 10 days OTC cough meds Force fluids Rest Tylenol for fever RTO prn  Follow Up Instructions: prn    I discussed the assessment and treatment plan with the patient. The patient was provided an opportunity to ask questions and all were answered. The patient agreed with the plan and demonstrated an understanding of the instructions.   The patient was advised to call back or seek an in-person evaluation if the symptoms worsen or if the condition fails to improve as anticipated.  The above assessment and management plan was discussed with the patient. The patient verbalized understanding of and has agreed to the management plan. Patient is aware to call the clinic if symptoms persist or worsen. Patient is aware when to return to the clinic for a follow-up visit. Patient educated on when it is appropriate to go to the emergency department.   Time call ended:  11:47  I provided 12 minutes of non-face-to-face time during this encounter.    Mary-Margaret Hassell Done, FNP

## 2019-02-04 ENCOUNTER — Other Ambulatory Visit: Payer: Self-pay | Admitting: Nurse Practitioner

## 2019-02-04 NOTE — Telephone Encounter (Signed)
Spoke to pt and advised there isn't any treatment for COVID except for OTC medications for symptoms, hydrate and rest. Pt denies SOB or difficulty breathing. Pt voiced understanding.

## 2019-03-10 ENCOUNTER — Telehealth: Payer: Self-pay | Admitting: Nurse Practitioner

## 2019-03-10 NOTE — Telephone Encounter (Signed)
Pt called and said he dropped off a Hazmat Form for [place] to fill out and would like it to be emailed to [email or web address] and would also like to be called when it has been done.

## 2019-03-11 NOTE — Telephone Encounter (Signed)
TC to Huey Bienenstock of [employer] @ [phone], she will contact employee about what is needed to be completed for his job. She will email me his paperwork again.

## 2019-03-16 ENCOUNTER — Telehealth: Payer: Self-pay | Admitting: Nurse Practitioner

## 2019-03-18 NOTE — Telephone Encounter (Signed)
Pt aware ppw was faxed as well as emailed to him and Huey Bienenstock at [employer]

## 2019-03-19 ENCOUNTER — Telehealth: Payer: Self-pay | Admitting: Nurse Practitioner

## 2019-03-19 NOTE — Telephone Encounter (Signed)
Please review and assist patient with request. Thanks

## 2019-03-19 NOTE — Telephone Encounter (Signed)
Pt called stating that he got a call from the nurse at work confirming that she received the HASMAT paperwork MMM filled out. Pt says the paperwork says he doesn't need to wear the mask but pt says he needs the paper to say that he does need to wear it. Requesting that the paperwork be corrected and emailed back to Huey Bienenstock (nurse) [email or web address]  Pt says he needs this paperwork asap because he has to be on HASMAT to keep his job. Wants to be called back about this by [facility] or Nurse.

## 2019-03-19 NOTE — Telephone Encounter (Signed)
Completed  Form and should be corretc

## 2019-03-19 NOTE — Telephone Encounter (Signed)
Paperwork corrected and discussed with patient. He states that he wants to be ok'd to wear the Hazmat suit and mask. Advised we would fax to [facility] with [employer].

## 2019-03-20 NOTE — Telephone Encounter (Signed)
Closing encounter, taken care of & form faxed on 03/19/19. Copy of form sent to scan center

## 2019-07-07 ENCOUNTER — Other Ambulatory Visit: Payer: Self-pay

## 2019-07-07 ENCOUNTER — Encounter: Payer: Self-pay | Admitting: Nurse Practitioner

## 2019-07-07 ENCOUNTER — Ambulatory Visit (INDEPENDENT_AMBULATORY_CARE_PROVIDER_SITE_OTHER): Payer: Self-pay | Admitting: Nurse Practitioner

## 2019-07-07 VITALS — BP 112/75 | HR 64 | Ht 71.0 in | Wt 200.0 lb

## 2019-07-07 DIAGNOSIS — Z024 Encounter for examination for driving license: Secondary | ICD-10-CM

## 2019-07-07 LAB — URINALYSIS
Bilirubin, UA: NEGATIVE
Glucose, UA: NEGATIVE
Ketones, UA: NEGATIVE
Leukocytes,UA: NEGATIVE
Nitrite, UA: NEGATIVE
Protein,UA: NEGATIVE
RBC, UA: NEGATIVE
Specific Gravity, UA: 1.025 (ref 1.005–1.030)
Urobilinogen, Ur: 0.2 mg/dL (ref 0.2–1.0)
pH, UA: 6 (ref 5.0–7.5)

## 2019-07-07 NOTE — Progress Notes (Signed)
Private DOT Phyical-see scanned in document

## 2019-07-18 IMAGING — RF DG HIP (WITH PELVIS) OPERATIVE*L*
1 series · 2 of 2 positions shown · non-contrast
Comparison: Left hip series of July 25, 2017

CLINICAL DATA: Status post left hip joint prosthesis placement.

EXAM:
OPERATIVE left HIP (WITH PELVIS IF PERFORMED) 2 VIEWS
TECHNIQUE: Fluoroscopic spot image(s) were submitted for interpretation
post-operatively.

[Series 1: unknown protocol · 0.20mm/px · 2 of 2 slices shown]
[im 1/2]
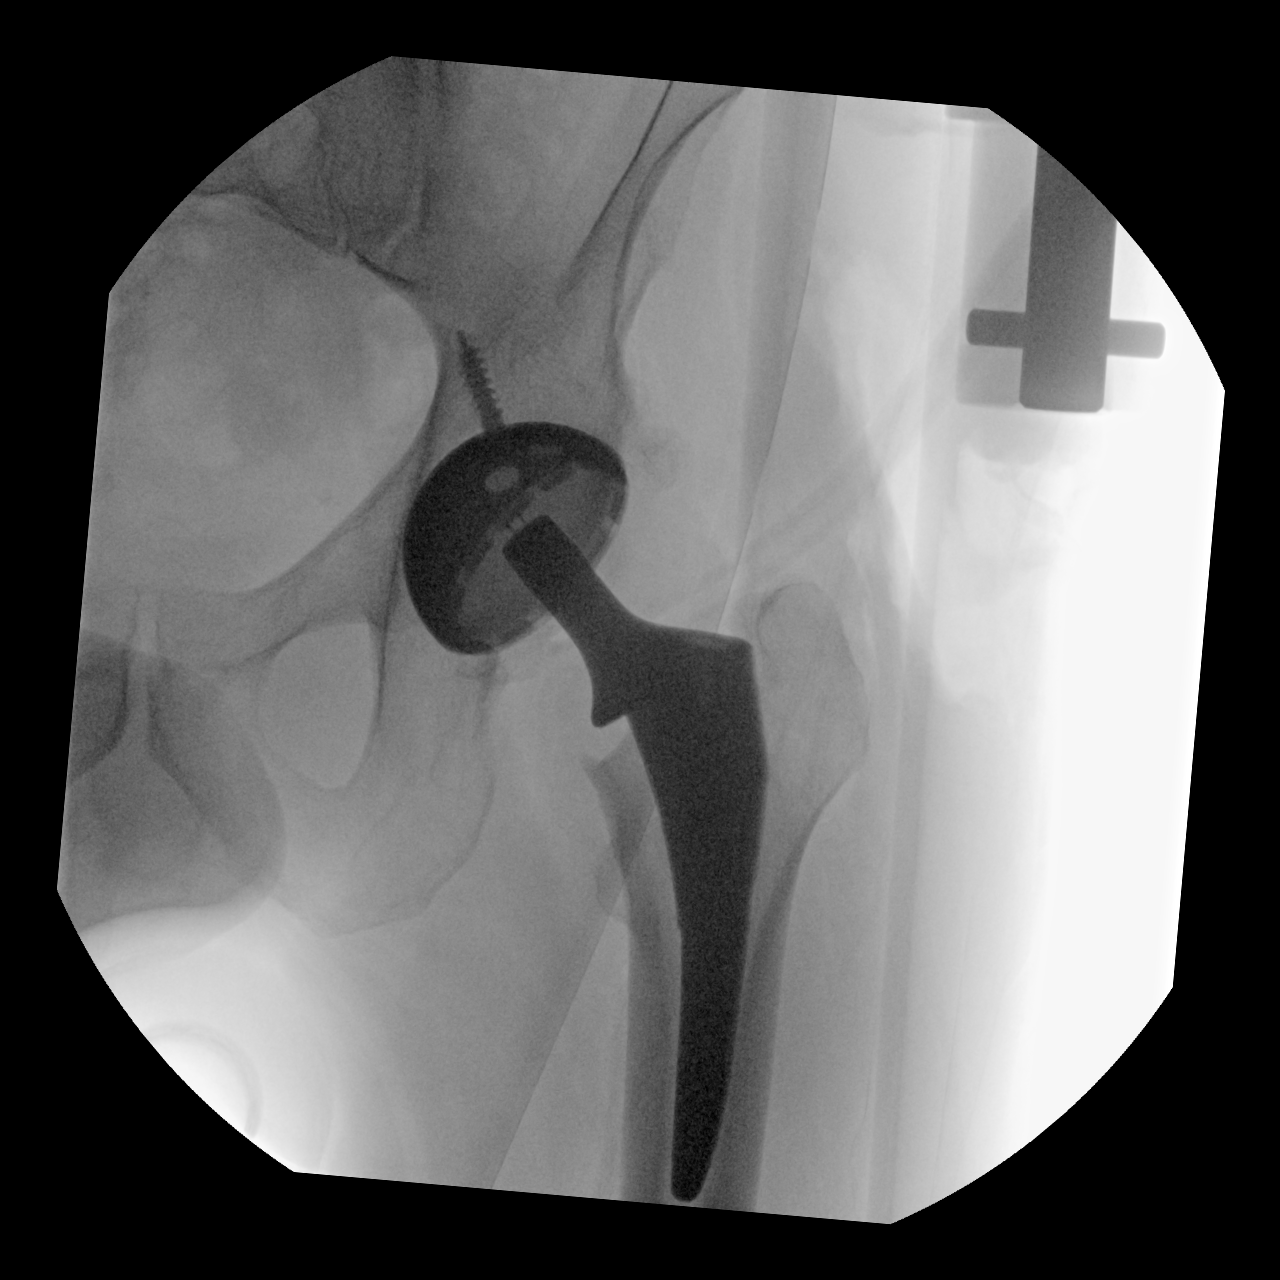
[im 2/2]
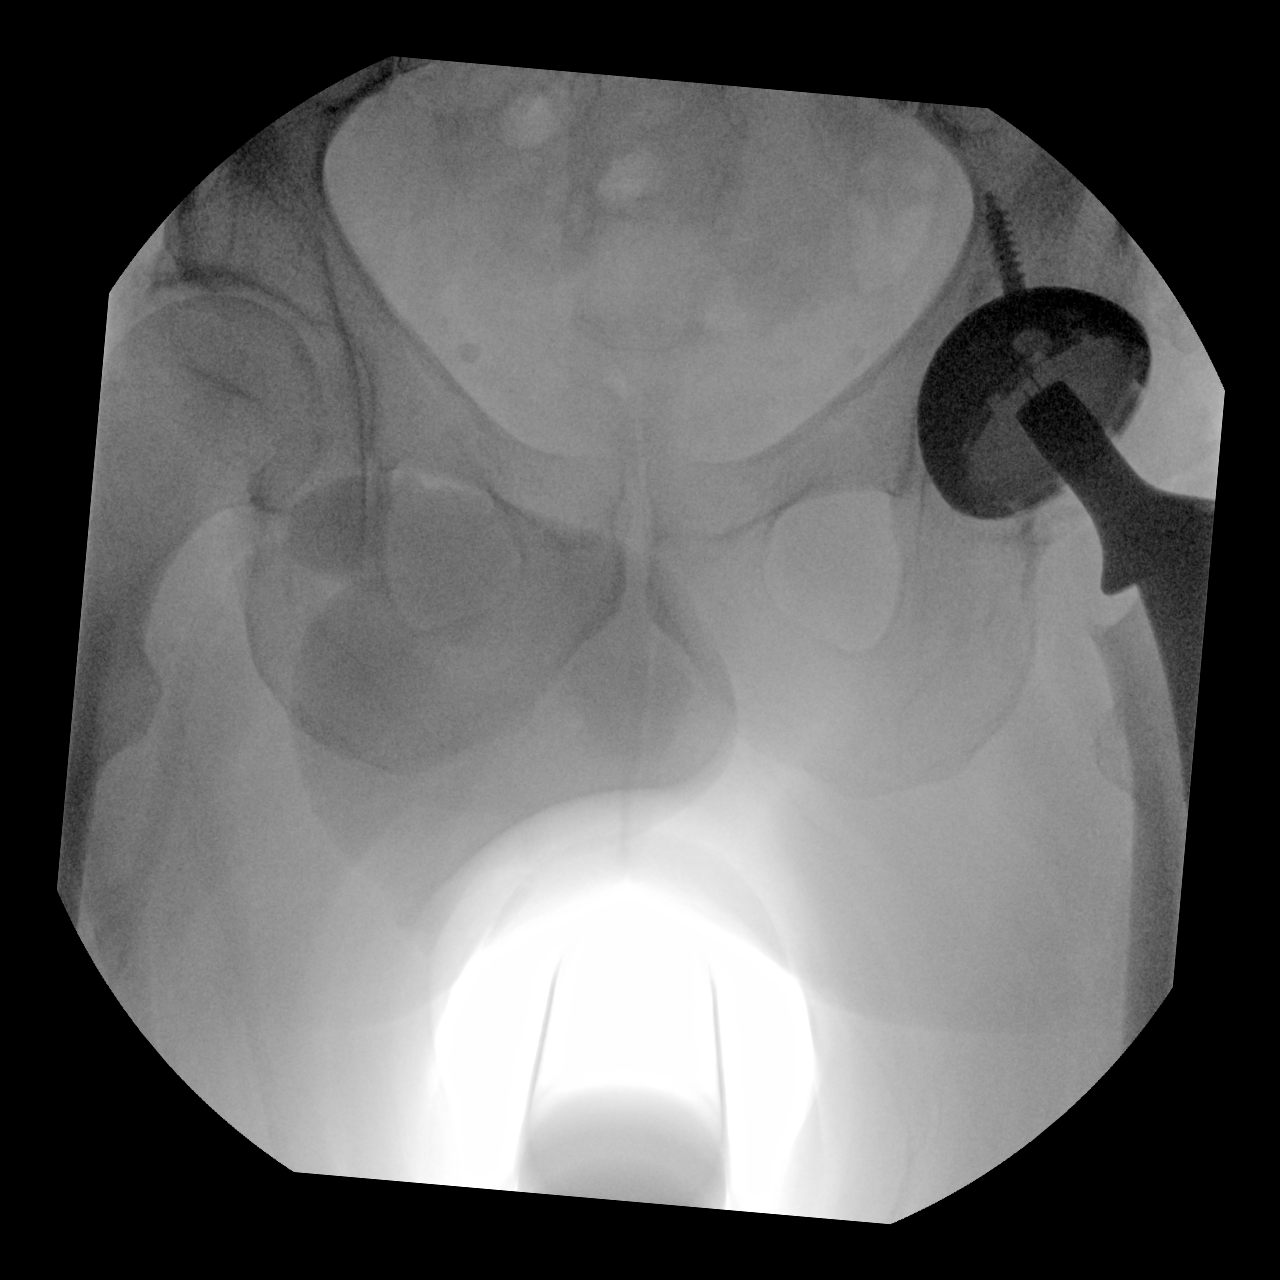

[2 of 2 positions shown; findings below may reference images not displayed]

FINDINGS: Reported fluoro time is 12 seconds corresponding to 1.43 mGy.

The patient has undergone left total hip joint prosthesis placement.
Radiographic positioning of the prosthetic components is good. The
interface with the native bone appears normal.
IMPRESSION: No immediate postprocedure complication following left total hip
joint prosthesis placement.

## 2020-03-21 ENCOUNTER — Telehealth: Payer: Self-pay

## 2020-03-21 NOTE — Telephone Encounter (Signed)
Ok to repeat letter for patient

## 2020-03-21 NOTE — Telephone Encounter (Signed)
Patient came in to office. New letter written and printed and given to patient

## 2020-04-09 IMAGING — MG DIGITAL DIAGNOSTIC BILATERAL MAMMOGRAM WITH TOMO AND CAD
6 of 12 series · 6 of 36 positions shown · non-contrast
Comparison: Previous exam(s).

CLINICAL DATA: 52-year-old female with a palpable abnormality in
the retroareolar right breast.

EXAM:
DIGITAL DIAGNOSTIC BILATERAL MAMMOGRAM WITH CAD AND TOMO

[R CC synth-2D (1 of 3)]
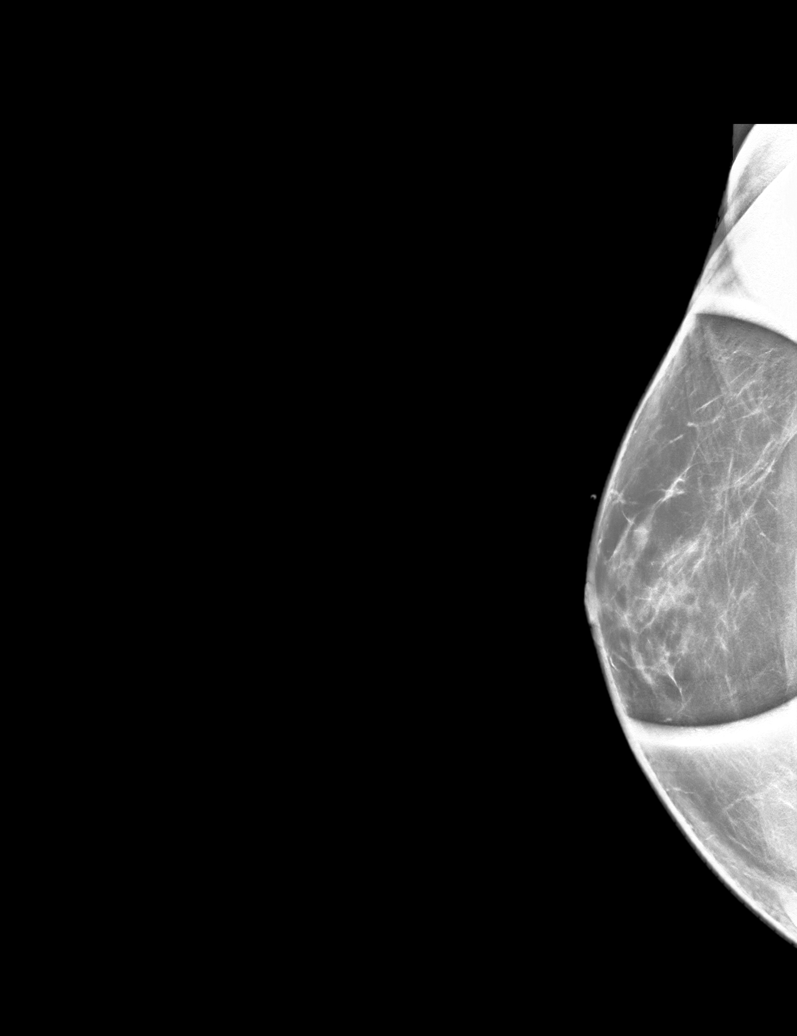

[L CC synth-2D]
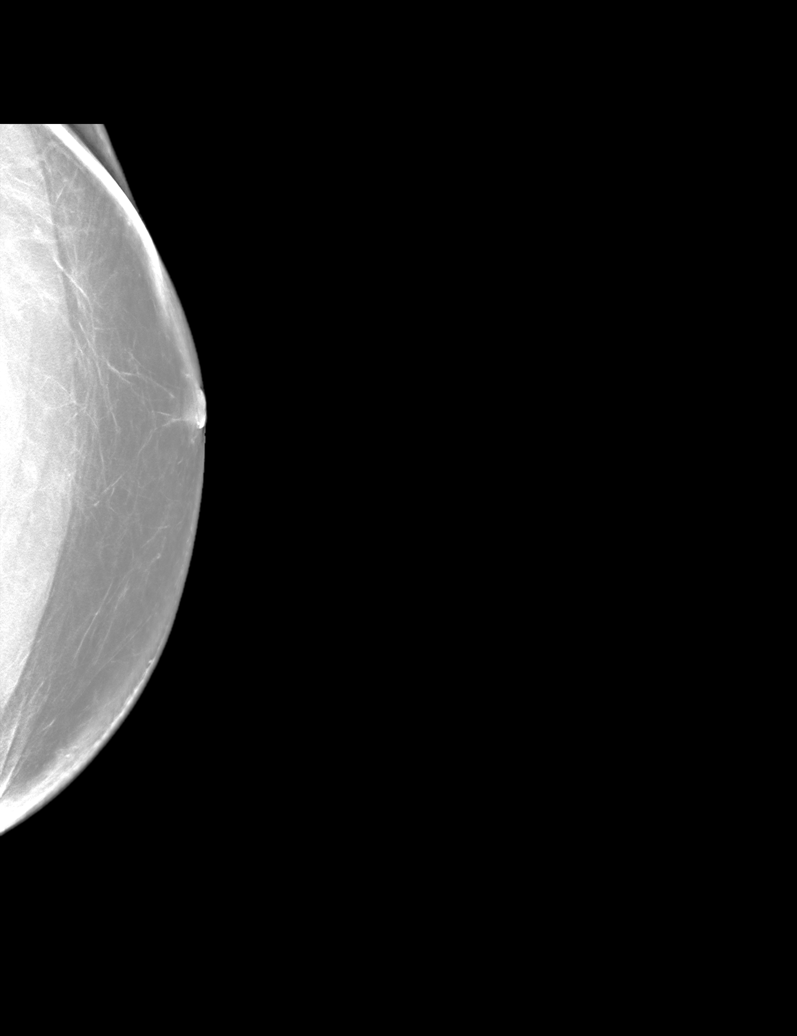

[R CC synth-2D (2 of 3)]
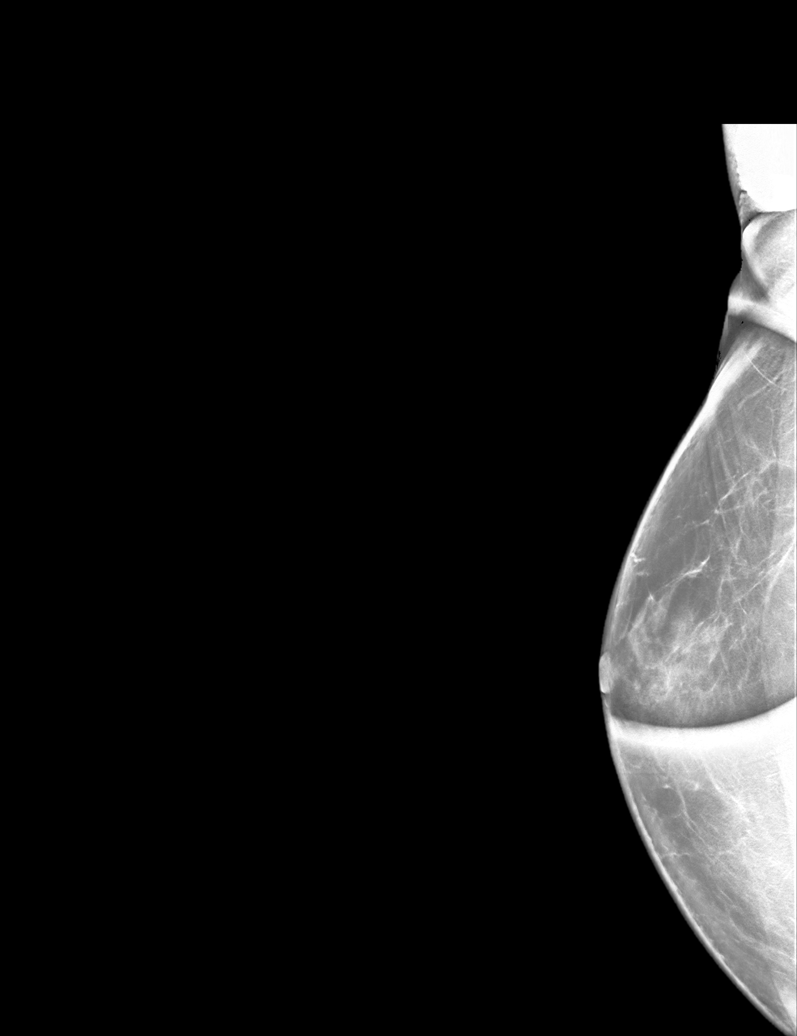

[L MLO synth-2D]
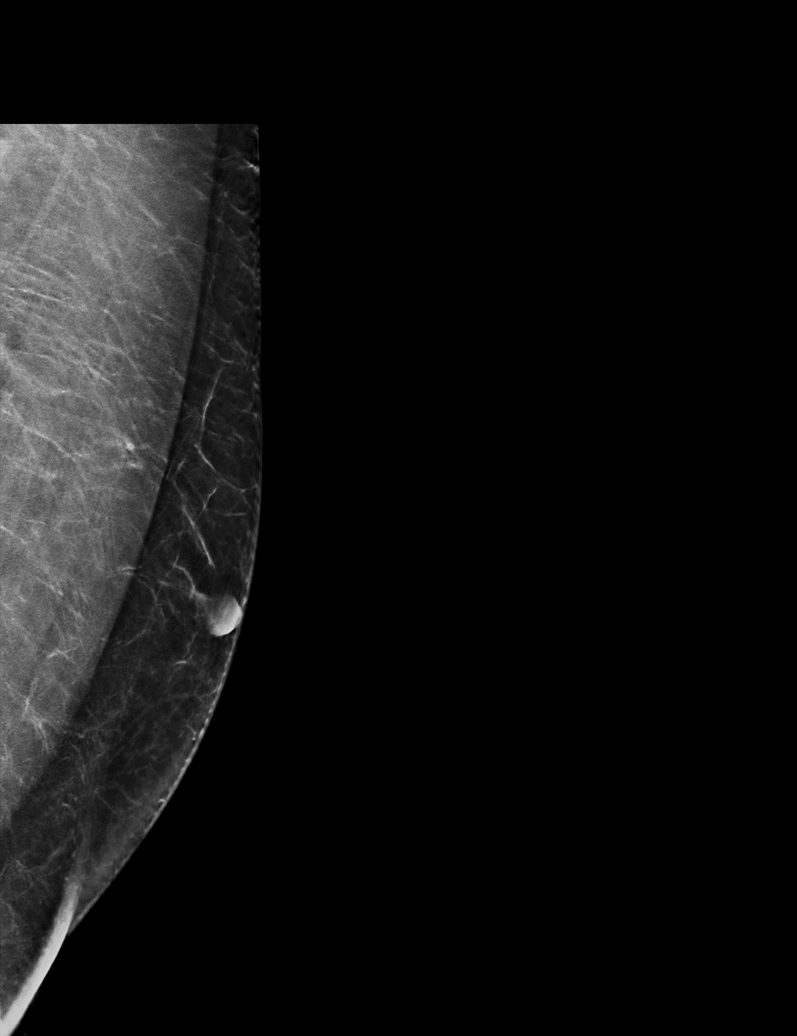

[R MLO synth-2D]
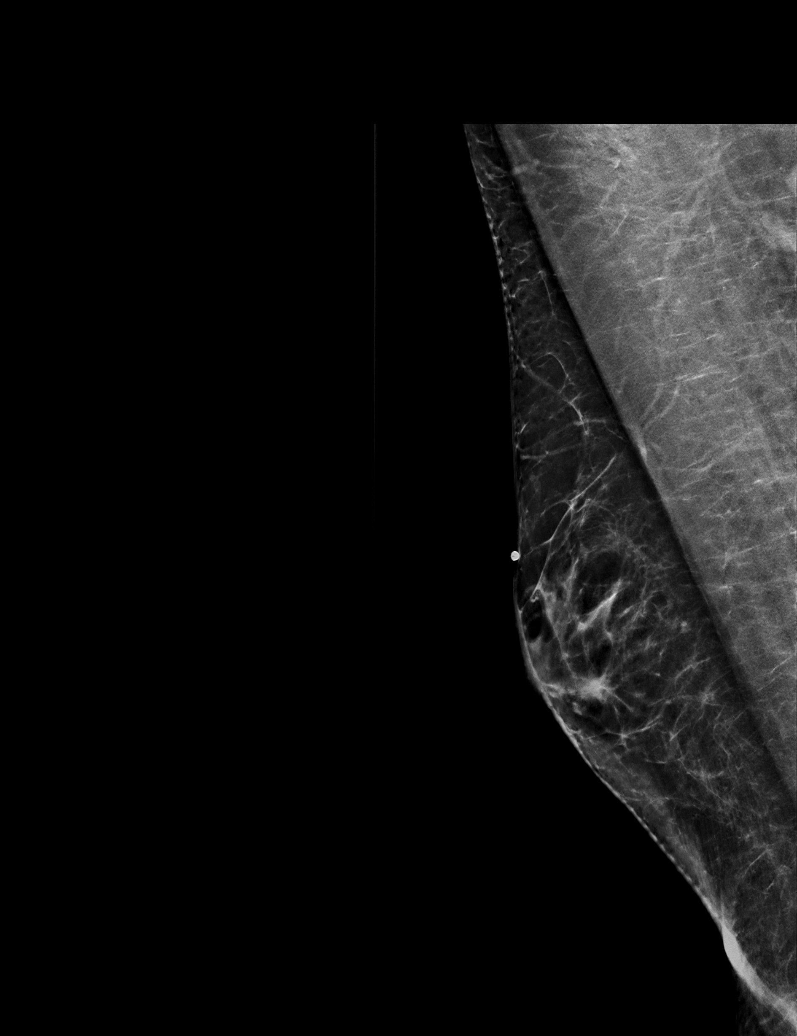

[R CC synth-2D (3 of 3)]
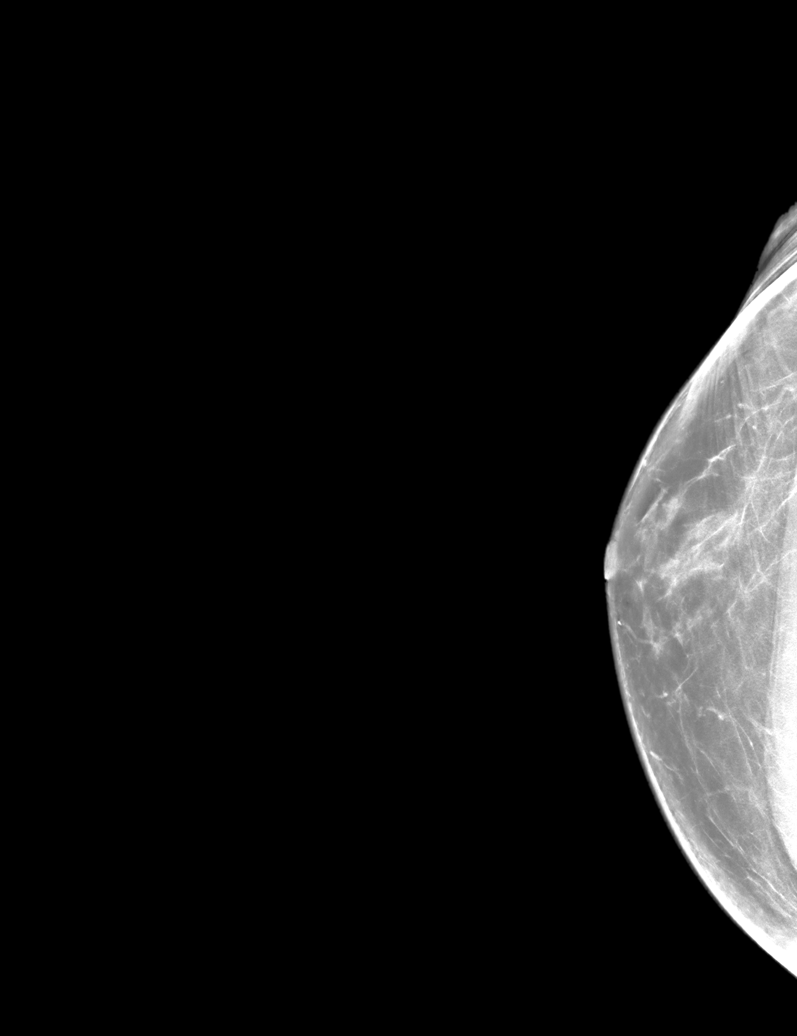

[6 of 36 positions shown; findings below may reference images not displayed]

ACR Breast Density Category b: There are scattered areas of
fibroglandular density.
FINDINGS: No suspicious masses or calcifications identified in either breast.
There is a right retroareolar flame shaped density most compatible
with gynecomastia. This corresponds with the area of palpable
concern. There is no mammographic evidence of malignancy.

Mammographic images were processed with CAD.
IMPRESSION: Gynecomastia, accounting for the right retroareolar palpable
abnormality. There are no findings of malignancy.

RECOMMENDATION:
Clinical follow-up.

I have discussed the findings and recommendations with the patient.
Results were also provided in writing at the conclusion of the
visit. If applicable, a reminder letter will be sent to the patient
regarding the next appointment.

BI-RADS CATEGORY  2: Benign.

## 2021-06-26 ENCOUNTER — Ambulatory Visit (INDEPENDENT_AMBULATORY_CARE_PROVIDER_SITE_OTHER): Payer: Self-pay | Admitting: Nurse Practitioner

## 2021-06-26 ENCOUNTER — Encounter: Payer: Self-pay | Admitting: Nurse Practitioner

## 2021-06-26 DIAGNOSIS — Z024 Encounter for examination for driving license: Secondary | ICD-10-CM

## 2021-06-26 LAB — URINALYSIS
Bilirubin, UA: NEGATIVE
Glucose, UA: NEGATIVE
Ketones, UA: NEGATIVE
Leukocytes,UA: NEGATIVE
Nitrite, UA: NEGATIVE
Protein,UA: NEGATIVE
RBC, UA: NEGATIVE
Specific Gravity, UA: 1.015 (ref 1.005–1.030)
Urobilinogen, Ur: 0.2 mg/dL (ref 0.2–1.0)
pH, UA: 6 (ref 5.0–7.5)

## 2021-06-26 NOTE — Progress Notes (Signed)
Private DOT- see scanned in document 

## 2024-01-01 ENCOUNTER — Other Ambulatory Visit (HOSPITAL_COMMUNITY): Payer: Self-pay | Admitting: Internal Medicine

## 2024-01-01 ENCOUNTER — Ambulatory Visit (HOSPITAL_COMMUNITY)
Admission: RE | Admit: 2024-01-01 | Discharge: 2024-01-01 | Disposition: A | Discharge status: Home / Self Care | Payer: PRIVATE HEALTH INSURANCE | Source: Ambulatory Visit | Attending: Internal Medicine | Admitting: Internal Medicine

## 2024-01-01 DIAGNOSIS — Z77090 Contact with and (suspected) exposure to asbestos: Secondary | ICD-10-CM | POA: Insufficient documentation

## 2024-01-09 ENCOUNTER — Encounter: Payer: Self-pay | Admitting: Nurse Practitioner

## 2024-01-09 ENCOUNTER — Ambulatory Visit (INDEPENDENT_AMBULATORY_CARE_PROVIDER_SITE_OTHER): Admitting: Nurse Practitioner

## 2024-01-09 VITALS — BP 117/78 | HR 64 | Temp 97.9°F | Ht 71.0 in | Wt 215.0 lb

## 2024-01-09 DIAGNOSIS — Z0000 Encounter for general adult medical examination without abnormal findings: Secondary | ICD-10-CM

## 2024-01-09 LAB — LIPID PANEL

## 2024-01-09 NOTE — Progress Notes (Signed)
 Subjective:    Patient ID: Collin Crosby, male    DOB: 07/29/1965, 58 y.o.   MRN: [id number]    Chief Complaint: Annual Exam    HPI:  Collin Crosby is a 59 y.o. who identifies as a male who was assigned male at birth.    He has no medical issues and is on  no chronic meds. No complaints today  There are no diagnoses linked to this encounter.  New complaints: None today  Allergies  Allergen Reactions   Shellfish Allergy Anaphylaxis, Itching, Swelling, Rash and Other (See Comments)    IV Dye and Shrimp   Penicillins Itching, Rash and Other (See Comments)    Has patient had a PCN reaction causing immediate rash, facial/tongue/throat swelling, SOB or lightheadedness with hypotension: No Has patient had a PCN reaction causing severe rash involving mucus membranes or skin necrosis: No Has patient had a PCN reaction that required hospitalization: No Has patient had a PCN reaction occurring within the last 10 years: No If all of the above answers are NO, then may proceed with Cephalosporin use.    Outpatient Encounter Medications as of 01/09/2024  Medication Sig   colestipol (COLESTID) 1 g tablet Take 2 g by mouth 2 (two) times daily.   No facility-administered encounter medications on file as of 01/09/2024.    Past Surgical History:  Procedure Laterality Date   TONSILLECTOMY     TOTAL HIP ARTHROPLASTY Left 12/10/2017   Procedure: LEFT TOTAL HIP ARTHROPLASTY ANTERIOR APPROACH;  Surgeon: Ernie Cough, MD;  Location: [facility];  Service: Orthopedics;  Laterality: Left;  70 mins    No family history on file.    Controlled substance contract: n/a    Review of Systems  Constitutional:  Negative for diaphoresis.  Eyes:  Negative for pain.  Respiratory:  Negative for shortness of breath.   Cardiovascular:  Negative for chest pain, palpitations and leg swelling.  Gastrointestinal:  Negative for abdominal pain.  Endocrine: Negative for polydipsia.  Skin:   Negative for rash.  Neurological:  Negative for dizziness, weakness and headaches.  Hematological:  Does not bruise/bleed easily.  All other systems reviewed and are negative.      Objective:   Physical Exam Vitals and nursing note reviewed.  Constitutional:      Appearance: Normal appearance. He is well-developed.  HENT:     Head: Normocephalic.     Nose: Nose normal.     Mouth/Throat:     Mouth: Mucous membranes are moist.     Pharynx: Oropharynx is clear.  Eyes:     Pupils: Pupils are equal, round, and reactive to light.  Neck:     Thyroid: No thyroid mass or thyromegaly.     Vascular: No carotid bruit or JVD.     Trachea: Phonation normal.  Cardiovascular:     Rate and Rhythm: Normal rate and regular rhythm.  Pulmonary:     Effort: Pulmonary effort is normal. No respiratory distress.     Breath sounds: Normal breath sounds.  Abdominal:     General: Bowel sounds are normal.     Palpations: Abdomen is soft.     Tenderness: There is no abdominal tenderness.  Musculoskeletal:        General: Normal range of motion.     Cervical back: Normal range of motion and neck supple.  Lymphadenopathy:     Cervical: No cervical adenopathy.  Skin:    General: Skin is warm and dry.  Neurological:  Mental Status: He is alert and oriented to person, place, and time.  Psychiatric:        Behavior: Behavior normal.        Thought Content: Thought content normal.        Judgment: Judgment normal.    BP 117/78   Pulse 64   Temp 97.9 F (36.6 C) (Temporal)   Ht 5' 11 (1.803 m)   Wt 215 lb (97.5 kg)   SpO2 99%   BMI 29.99 kg/m         Assessment & Plan:   Ozell Dale Core in today with chief complaint of Annual Exam   1. Annual physical exam (Primary) Labs pending Low fat diet and exercise - CBC with Differential/Platelet - CMP14+EGFR - Lipid panel - Thyroid Panel With TSH - VITAMIN D 25 Hydroxy (Vit-D Deficiency, Fractures)    The above assessment and  management plan was discussed with the patient. The patient verbalized understanding of and has agreed to the management plan. Patient is aware to call the clinic if symptoms persist or worsen. Patient is aware when to return to the clinic for a follow-up visit. Patient educated on when it is appropriate to go to the emergency department.   Mary-Margaret Gladis, FNP

## 2024-01-09 NOTE — Patient Instructions (Signed)
 Exercising to Stay Healthy To become healthy and stay healthy, it is recommended that you do moderate-intensity and vigorous-intensity exercise. You can tell that you are exercising at a moderate intensity if your heart starts beating faster and you start breathing faster but can still hold a conversation. You can tell that you are exercising at a vigorous intensity if you are breathing much harder and faster and cannot hold a conversation while exercising. How can exercise benefit me? Exercising regularly is important. It has many health benefits, such as: Improving overall fitness, flexibility, and endurance. Increasing bone density. Helping with weight control. Decreasing body fat. Increasing muscle strength and endurance. Reducing stress and tension, anxiety, depression, or anger. Improving overall health. What guidelines should I follow while exercising? Before you start a new exercise program, talk with your health care provider. Do not exercise so much that you hurt yourself, feel dizzy, or get very short of breath. Wear comfortable clothes and wear shoes with good support. Drink plenty of water while you exercise to prevent dehydration or heat stroke. Work out until your breathing and your heartbeat get faster (moderate intensity). How often should I exercise? Choose an activity that you enjoy, and set realistic goals. Your health care provider can help you make an activity plan that is individually designed and works best for you. Exercise regularly as told by your health care provider. This may include: Doing strength training two times a week, such as: Lifting weights. Using resistance bands. Push-ups. Sit-ups. Yoga. Doing a certain intensity of exercise for a given amount of time. Choose from these options: A total of 150 minutes of moderate-intensity exercise every week. A total of 75 minutes of vigorous-intensity exercise every week. A mix of moderate-intensity and  vigorous-intensity exercise every week. Children, pregnant women, people who have not exercised regularly, people who are overweight, and older adults may need to talk with a health care provider about what activities are safe to perform. If you have a medical condition, be sure to talk with your health care provider before you start a new exercise program. What are some exercise ideas? Moderate-intensity exercise ideas include: Walking 1 mile (1.6 km) in about 15 minutes. Biking. Hiking. Golfing. Dancing. Water aerobics. Vigorous-intensity exercise ideas include: Walking 4.5 miles (7.2 km) or more in about 1 hour. Jogging or running 5 miles (8 km) in about 1 hour. Biking 10 miles (16.1 km) or more in about 1 hour. Lap swimming. Roller-skating or in-line skating. Cross-country skiing. Vigorous competitive sports, such as football, basketball, and soccer. Jumping rope. Aerobic dancing. What are some everyday activities that can help me get exercise? Yard work, such as: Child psychotherapist. Raking and bagging leaves. Washing your car. Pushing a stroller. Shoveling snow. Gardening. Washing windows or floors. How can I be more active in my day-to-day activities? Use stairs instead of an elevator. Take a walk during your lunch break. If you drive, park your car farther away from your work or school. If you take public transportation, get off one stop early and walk the rest of the way. Stand up or walk around during all of your indoor phone calls. Get up, stretch, and walk around every 30 minutes throughout the day. Enjoy exercise with a friend. Support to continue exercising will help you keep a regular routine of activity. Where to find more information You can find more information about exercising to stay healthy from: [place] Department of Health and Human Services: [employer] Centers for Disease Control and Prevention (  CDC): [employer] Summary Exercising regularly is  important. It will improve your overall fitness, flexibility, and endurance. Regular exercise will also improve your overall health. It can help you control your weight, reduce stress, and improve your bone density. Do not exercise so much that you hurt yourself, feel dizzy, or get very short of breath. Before you start a new exercise program, talk with your health care provider. This information is not intended to replace advice given to you by your health care provider. Make sure you discuss any questions you have with your health care provider. Document Revised: 06/10/2020 Document Reviewed: 06/10/2020 [employer] Patient Education  2024 [employer].

## 2024-01-10 LAB — CMP14+EGFR
ALT: 21 IU/L (ref 0–44)
AST: 19 IU/L (ref 0–40)
Albumin: 4.5 g/dL (ref 3.8–4.9)
Alkaline Phosphatase: 90 IU/L (ref 47–123)
BUN/Creatinine Ratio: 14 (ref 9–20)
BUN: 14 mg/dL (ref 6–24)
Bilirubin Total: 0.7 mg/dL (ref 0.0–1.2)
CO2: 21 mmol/L (ref 20–29)
Calcium: 10.5 mg/dL — AB (ref 8.7–10.2)
Chloride: 103 mmol/L (ref 96–106)
Creatinine, Ser: 1 mg/dL (ref 0.76–1.27)
Globulin, Total: 2.7 g/dL (ref 1.5–4.5)
Glucose: 100 mg/dL — AB (ref 70–99)
Potassium: 4.6 mmol/L (ref 3.5–5.2)
Sodium: 140 mmol/L (ref 134–144)
Total Protein: 7.2 g/dL (ref 6.0–8.5)
eGFR: 88 mL/min/1.73 (ref >59)

## 2024-01-10 LAB — CBC WITH DIFFERENTIAL/PLATELET
Basophils Absolute: 0 x10E3/uL (ref 0.0–0.2)
Basos: 1 %
EOS (ABSOLUTE): 0.1 x10E3/uL (ref 0.0–0.4)
Eos: 2 %
Hematocrit: 46.3 % (ref 37.5–51.0)
Hemoglobin: 16.1 g/dL (ref 13.0–17.7)
Immature Grans (Abs): 0 x10E3/uL (ref 0.0–0.1)
Immature Granulocytes: 0 %
Lymphocytes Absolute: 1.4 x10E3/uL (ref 0.7–3.1)
Lymphs: 28 %
MCH: 32 pg (ref 26.6–33.0)
MCHC: 34.8 g/dL (ref 31.5–35.7)
MCV: 92 fL (ref 79–97)
Monocytes Absolute: 0.4 x10E3/uL (ref 0.1–0.9)
Monocytes: 8 %
Neutrophils Absolute: 3.2 x10E3/uL (ref 1.4–7.0)
Neutrophils: 61 %
Platelets: 197 x10E3/uL (ref 150–450)
RBC: 5.03 x10E6/uL (ref 4.14–5.80)
RDW: 12.7 % (ref 11.6–15.4)
WBC: 5.2 x10E3/uL (ref 3.4–10.8)

## 2024-01-10 LAB — THYROID PANEL WITH TSH
Free Thyroxine Index: 2.2 (ref 1.2–4.9)
T3 Uptake Ratio: 30 % (ref 24–39)
T4, Total: 7.4 ug/dL (ref 4.5–12.0)
TSH: 2.46 u[IU]/mL (ref 0.450–4.500)

## 2024-01-10 LAB — VITAMIN D 25 HYDROXY (VIT D DEFICIENCY, FRACTURES): Vit D, 25-Hydroxy: 50.4 ng/mL (ref 30.0–100.0)

## 2024-01-10 LAB — LIPID PANEL
Cholesterol, Total: 220 mg/dL — AB (ref 100–199)
HDL: 40 mg/dL (ref >39)
LDL CALC COMMENT:: 5.5 ratio — AB (ref 0.0–5.0)
LDL Chol Calc (NIH): 140 mg/dL — AB (ref 0–99)
Triglycerides: 222 mg/dL — AB (ref 0–149)
VLDL Cholesterol Cal: 40 mg/dL (ref 5–40)

## 2024-01-13 ENCOUNTER — Ambulatory Visit: Payer: Self-pay | Admitting: Nurse Practitioner
